# Patient Record
Sex: Male | Born: 1960 | Race: Black or African American | Hispanic: No | Marital: Married | Smoking: Never smoker
Health system: Southern US, Community
[De-identification: ages and names within clinical notes are randomized; demographics above are authoritative.]

## PROBLEM LIST (undated history)

## (undated) DIAGNOSIS — I10 Essential (primary) hypertension: Secondary | ICD-10-CM

## (undated) DIAGNOSIS — E119 Type 2 diabetes mellitus without complications: Secondary | ICD-10-CM

## (undated) HISTORY — PX: KNEE SURGERY: SHX244

## (undated) HISTORY — PX: FEMUR FRACTURE SURGERY: SHX633

---

## 1998-11-18 ENCOUNTER — Emergency Department (HOSPITAL_COMMUNITY): Admission: EM | Admit: 1998-11-18 | Discharge: 1998-11-18 | Payer: Self-pay | Admitting: Emergency Medicine

## 2000-03-08 ENCOUNTER — Ambulatory Visit (HOSPITAL_BASED_OUTPATIENT_CLINIC_OR_DEPARTMENT_OTHER): Admission: RE | Admit: 2000-03-08 | Discharge: 2000-03-08 | Payer: Self-pay | Admitting: Otolaryngology

## 2010-03-29 ENCOUNTER — Inpatient Hospital Stay (HOSPITAL_COMMUNITY)
Admission: EM | Admit: 2010-03-29 | Discharge: 2010-04-06 | Payer: Self-pay | Source: Home / Self Care | Attending: Orthopedic Surgery | Admitting: Orthopedic Surgery

## 2010-07-07 LAB — CBC
HCT: 21.3 % — ABNORMAL LOW (ref 39.0–52.0)
HCT: 24.5 % — ABNORMAL LOW (ref 39.0–52.0)
HCT: 33 % — ABNORMAL LOW (ref 39.0–52.0)
HCT: 44.8 % (ref 39.0–52.0)
Hemoglobin: 11.3 g/dL — ABNORMAL LOW (ref 13.0–17.0)
Hemoglobin: 15.5 g/dL (ref 13.0–17.0)
Hemoglobin: 8.9 g/dL — ABNORMAL LOW (ref 13.0–17.0)
MCH: 30.8 pg (ref 26.0–34.0)
MCH: 31.4 pg (ref 26.0–34.0)
MCH: 31.4 pg (ref 26.0–34.0)
MCHC: 34.6 g/dL (ref 30.0–36.0)
MCHC: 35.2 g/dL (ref 30.0–36.0)
MCHC: 35.5 g/dL (ref 30.0–36.0)
MCV: 88.4 fL (ref 78.0–100.0)
MCV: 90 fL (ref 78.0–100.0)
MCV: 90.7 fL (ref 78.0–100.0)
MCV: 90.9 fL (ref 78.0–100.0)
RBC: 2.89 MIL/uL — ABNORMAL LOW (ref 4.22–5.81)
RBC: 3.64 MIL/uL — ABNORMAL LOW (ref 4.22–5.81)
RDW: 13.4 % (ref 11.5–15.5)
RDW: 13.7 % (ref 11.5–15.5)
RDW: 14.1 % (ref 11.5–15.5)
WBC: 11.2 10*3/uL — ABNORMAL HIGH (ref 4.0–10.5)
WBC: 11.6 10*3/uL — ABNORMAL HIGH (ref 4.0–10.5)

## 2010-07-07 LAB — POCT I-STAT, CHEM 8
BUN: 10 mg/dL (ref 6–23)
Calcium, Ion: 1.05 mmol/L — ABNORMAL LOW (ref 1.12–1.32)
Chloride: 102 mEq/L (ref 96–112)
Creatinine, Ser: 1.1 mg/dL (ref 0.4–1.5)
Sodium: 139 mEq/L (ref 135–145)

## 2010-07-07 LAB — CROSSMATCH
Antibody Screen: NEGATIVE
Unit division: 0

## 2010-07-07 LAB — BASIC METABOLIC PANEL
BUN: 7 mg/dL (ref 6–23)
BUN: 8 mg/dL (ref 6–23)
CO2: 30 mEq/L (ref 19–32)
CO2: 30 mEq/L (ref 19–32)
Chloride: 97 mEq/L (ref 96–112)
Chloride: 99 mEq/L (ref 96–112)
Creatinine, Ser: 1.12 mg/dL (ref 0.4–1.5)
GFR calc Af Amer: >60 mL/min (ref >60)
GFR calc non Af Amer: >60 mL/min (ref >60)
Glucose, Bld: 138 mg/dL — ABNORMAL HIGH (ref 70–99)
Glucose, Bld: 160 mg/dL — ABNORMAL HIGH (ref 70–99)
Potassium: 3.4 mEq/L — ABNORMAL LOW (ref 3.5–5.1)
Potassium: 3.8 mEq/L (ref 3.5–5.1)
Potassium: 3.8 mEq/L (ref 3.5–5.1)

## 2010-07-07 LAB — COMPREHENSIVE METABOLIC PANEL
ALT: 29 U/L (ref 0–53)
BUN: 9 mg/dL (ref 6–23)
CO2: 25 mEq/L (ref 19–32)
Calcium: 8.2 mg/dL — ABNORMAL LOW (ref 8.4–10.5)
GFR calc non Af Amer: >60 mL/min (ref >60)
Glucose, Bld: 141 mg/dL — ABNORMAL HIGH (ref 70–99)
Sodium: 135 mEq/L (ref 135–145)
Total Protein: 6 g/dL (ref 6.0–8.3)

## 2010-07-07 LAB — ETHANOL: Alcohol, Ethyl (B): <5 mg/dL (ref 0–10)

## 2010-07-07 LAB — LACTIC ACID, PLASMA: Lactic Acid, Venous: 3.3 mmol/L — ABNORMAL HIGH (ref 0.5–2.2)

## 2010-07-07 LAB — PROTIME-INR
INR: 0.88 (ref 0.00–1.49)
Prothrombin Time: 12.1 seconds (ref 11.6–15.2)

## 2010-07-07 LAB — ABO/RH: ABO/RH(D): O POS

## 2010-07-07 LAB — SAMPLE TO BLOOD BANK

## 2012-12-26 ENCOUNTER — Emergency Department (HOSPITAL_COMMUNITY)
Admission: EM | Admit: 2012-12-26 | Discharge: 2012-12-26 | Disposition: A | Discharge status: Home / Self Care | Payer: BC Managed Care – PPO | Attending: Emergency Medicine | Admitting: Emergency Medicine

## 2012-12-26 ENCOUNTER — Encounter (HOSPITAL_COMMUNITY): Payer: Self-pay | Admitting: *Deleted

## 2012-12-26 DIAGNOSIS — Z711 Person with feared health complaint in whom no diagnosis is made: Secondary | ICD-10-CM

## 2012-12-26 DIAGNOSIS — R51 Headache: Secondary | ICD-10-CM | POA: Insufficient documentation

## 2012-12-26 DIAGNOSIS — K921 Melena: Secondary | ICD-10-CM | POA: Insufficient documentation

## 2012-12-26 DIAGNOSIS — R109 Unspecified abdominal pain: Secondary | ICD-10-CM | POA: Insufficient documentation

## 2012-12-26 DIAGNOSIS — E669 Obesity, unspecified: Secondary | ICD-10-CM | POA: Insufficient documentation

## 2012-12-26 DIAGNOSIS — Z202 Contact with and (suspected) exposure to infections with a predominantly sexual mode of transmission: Secondary | ICD-10-CM | POA: Insufficient documentation

## 2012-12-26 LAB — COMPREHENSIVE METABOLIC PANEL
Alkaline Phosphatase: 62 U/L (ref 39–117)
BUN: 12 mg/dL (ref 6–23)
Calcium: 8.9 mg/dL (ref 8.4–10.5)
Creatinine, Ser: 1 mg/dL (ref 0.50–1.35)
GFR calc Af Amer: >90 mL/min (ref >90)
Glucose, Bld: 137 mg/dL — ABNORMAL HIGH (ref 70–99)
Potassium: 4.4 mEq/L (ref 3.5–5.1)
Total Protein: 6.4 g/dL (ref 6.0–8.3)

## 2012-12-26 LAB — URINE MICROSCOPIC-ADD ON

## 2012-12-26 LAB — CBC WITH DIFFERENTIAL/PLATELET
Basophils Absolute: 0 10*3/uL (ref 0.0–0.1)
HCT: 43.8 % (ref 39.0–52.0)
Lymphocytes Relative: 35 % (ref 12–46)
Monocytes Absolute: 0.8 10*3/uL (ref 0.1–1.0)
Neutro Abs: 4.9 10*3/uL (ref 1.7–7.7)
RDW: 13.3 % (ref 11.5–15.5)
WBC: 8.9 10*3/uL (ref 4.0–10.5)

## 2012-12-26 LAB — LIPASE, BLOOD: Lipase: 31 U/L (ref 11–59)

## 2012-12-26 LAB — URINALYSIS, ROUTINE W REFLEX MICROSCOPIC
Nitrite: NEGATIVE
Specific Gravity, Urine: 1.016 (ref 1.005–1.030)
pH: 6 (ref 5.0–8.0)

## 2012-12-26 MED ORDER — CEFTRIAXONE SODIUM 250 MG IJ SOLR
250.0000 mg | Freq: Once | INTRAMUSCULAR | Status: AC
  Administered 2012-12-26: 250 mg via INTRAMUSCULAR
  Filled 2012-12-26: qty 250

## 2012-12-26 MED ORDER — AZITHROMYCIN 250 MG PO TABS
1000.0000 mg | ORAL_TABLET | Freq: Once | ORAL | Status: AC
  Administered 2012-12-26: 1000 mg via ORAL
  Filled 2012-12-26: qty 4

## 2012-12-26 NOTE — ED Provider Notes (Signed)
CSN: [id number]     Arrival date & time 12/26/12  0310 History   First MD Initiated Contact with Patient 12/26/12 [phone]     Chief Complaint  Patient presents with  . Abdominal Pain   (Consider location/radiation/quality/duration/timing/severity/associated sxs/prior Treatment) HPI This is a 52 year old male who presents with abdominal pain. The patient reports 3-4 days of crampy lower quadrant abdominal pain. He reports his pain is 3/10 right now. It is non-radiating. It is worse when he lays down. It is not getting any better with Alka-Seltzer. He describes it as an 8. He denies any nausea, vomiting, diarrhea. The patient reports a small amount of blood in his stool 3 days ago. He denies any fevers. Patient reports being diagnosed in the past with possible diverticulosis.    History reviewed. No pertinent past medical history. Past Surgical History  Procedure Laterality Date  . Femur fracture surgery     No family history on file. History  Substance Use Topics  . Smoking status: Never Smoker   . Smokeless tobacco: Not on file  . Alcohol Use: Not on file    Review of Systems  Constitutional: Negative.  Negative for fever.  Respiratory: Negative.  Negative for chest tightness and shortness of breath.   Cardiovascular: Negative.  Negative for chest pain.  Gastrointestinal: Positive for abdominal pain and blood in stool. Negative for nausea, vomiting and diarrhea.  Genitourinary: Negative.  Negative for discharge and testicular pain.  Skin: Negative for rash.  Neurological: Positive for headaches.  Psychiatric/Behavioral: The patient is not nervous/anxious.   All other systems reviewed and are negative.    Allergies  Review of patient's allergies indicates no known allergies.  Home Medications  No current outpatient prescriptions on file. BP 148/88  Pulse 70  Temp(Src) 98.9 F (37.2 C) (Oral)  Resp 20  Ht 5\' 6"  (1.676 m)  Wt 234 lb 6 oz (106.312 kg)  BMI 37.85 kg/m2  SpO2  100% Physical Exam  Nursing note and vitals reviewed. Constitutional: He is oriented to person, place, and time. He appears well-developed and well-nourished.  obese  HENT:  Head: Normocephalic and atraumatic.  Eyes: Pupils are equal, round, and reactive to light.  Neck: Neck supple.  Cardiovascular: Normal rate, regular rhythm and normal heart sounds.   No murmur heard. Pulmonary/Chest: Effort normal and breath sounds normal. No respiratory distress. He has no wheezes.  Abdominal: Soft. Bowel sounds are normal. He exhibits no distension. There is no tenderness. There is no rebound.  Genitourinary: Penis normal. Guaiac negative stool.  Musculoskeletal: He exhibits no edema.  Lymphadenopathy:    He has no cervical adenopathy.  Neurological: He is alert and oriented to person, place, and time.  Skin: Skin is warm and dry.  Psychiatric: He has a normal mood and affect.    ED Course  Procedures (including critical care time) Labs Review Labs Reviewed  COMPREHENSIVE METABOLIC PANEL - Abnormal; Notable for the following:    Glucose, Bld 137 (*)    Albumin 3.4 (*)    GFR calc non Af Amer 86 (*)    All other components within normal limits  URINALYSIS, ROUTINE W REFLEX MICROSCOPIC - Abnormal; Notable for the following:    Leukocytes, UA SMALL (*)    All other components within normal limits  GC/CHLAMYDIA PROBE AMP  CBC WITH DIFFERENTIAL  LIPASE, BLOOD  URINE MICROSCOPIC-ADD ON   Imaging Review No results found.  MDM   1. Abdominal pain   2. Concern about STD in  male without diagnosis     This is a 52 year old male who presents with 3 days of abdominal pain. He is nontoxic-appearing on exam his vital signs are within normal limits. Lab work was obtained.  And is unremarkable with the exception of small leukocytes and rare bacteria in his urine. I discussed these findings with the patient. He is monogamous and denies any penile discharge. Patient was tested for [facility] and Chlamydia.  He will be appear to be treated. I discussed with the patient his other normal lab findings. At this time given his reassuring exam, normal vital signs, and reassuring labs, I have low suspicion for intra-abdominal pathology. Patient does have a history of diverticulosis. He is guaiuc negative here.  Other considerations include colitis, however the patient is not having any diarrhea. Patient has a primary care physician he can followup with. He was given strict return precautions. He is to followup with his primary care physician for repeat abdominal exam. If he has worsening of his symptoms he is encouraged come back for imaging.  After history, exam, and medical workup I feel the patient has been appropriately medically screened and is safe for discharge home. Pertinent diagnoses were discussed with the patient. Patient was given return precautions.   Shon Baton, MD 12/26/12 [phone]

## 2012-12-26 NOTE — ED Notes (Signed)
Pt c/o lower abd pain x 3-4 days; denies n/v/d; denies urinary symptoms

## 2012-12-27 ENCOUNTER — Emergency Department (HOSPITAL_COMMUNITY)
Admission: EM | Admit: 2012-12-27 | Discharge: 2012-12-27 | Disposition: A | Discharge status: Home / Self Care | Payer: BC Managed Care – PPO | Attending: Emergency Medicine | Admitting: Emergency Medicine

## 2012-12-27 ENCOUNTER — Emergency Department (HOSPITAL_COMMUNITY): Payer: BC Managed Care – PPO

## 2012-12-27 ENCOUNTER — Encounter (HOSPITAL_COMMUNITY): Payer: Self-pay | Admitting: Family Medicine

## 2012-12-27 DIAGNOSIS — R0789 Other chest pain: Secondary | ICD-10-CM | POA: Insufficient documentation

## 2012-12-27 DIAGNOSIS — R079 Chest pain, unspecified: Secondary | ICD-10-CM

## 2012-12-27 LAB — TROPONIN I: Troponin I: <0.3 ng/mL (ref <0.30)

## 2012-12-27 LAB — COMPREHENSIVE METABOLIC PANEL
Alkaline Phosphatase: 69 U/L (ref 39–117)
BUN: 10 mg/dL (ref 6–23)
Calcium: 9.4 mg/dL (ref 8.4–10.5)
GFR calc Af Amer: >90 mL/min (ref >90)
GFR calc non Af Amer: 82 mL/min — ABNORMAL LOW (ref >90)
Glucose, Bld: 109 mg/dL — ABNORMAL HIGH (ref 70–99)
Total Protein: 6.2 g/dL (ref 6.0–8.3)

## 2012-12-27 LAB — CBC
HCT: 43.5 % (ref 39.0–52.0)
Hemoglobin: 14.8 g/dL (ref 13.0–17.0)
MCH: 30.2 pg (ref 26.0–34.0)
MCHC: 34 g/dL (ref 30.0–36.0)

## 2012-12-27 MED ORDER — GI COCKTAIL ~~LOC~~
30.0000 mL | Freq: Once | ORAL | Status: AC
  Administered 2012-12-27: 30 mL via ORAL
  Filled 2012-12-27: qty 30

## 2012-12-27 MED ORDER — FAMOTIDINE 20 MG PO TABS
20.0000 mg | ORAL_TABLET | Freq: Once | ORAL | Status: AC
  Administered 2012-12-27: 20 mg via ORAL
  Filled 2012-12-27: qty 1

## 2012-12-27 NOTE — ED Provider Notes (Signed)
CSN: [id number]     Arrival date & time 12/27/12  0148 History   First MD Initiated Contact with Patient 12/27/12 0255     Chief Complaint  Patient presents with  . Chest Pain   (Consider location/radiation/quality/duration/timing/severity/associated sxs/prior Treatment) Patient is a 52 y.o. male presenting with chest pain. The history is provided by the patient.  Chest Pain Associated symptoms: no abdominal pain, no back pain, no cough, no fever, no headache, no nausea, no shortness of breath and not vomiting   pt c/o cp for the past 1-2 days. Constant. Midline, lower sternal area. Dull, moderate. Non radiating. No pleuritic pain. No leg pain or swelling.  Worse w resting/lying flat. No relation to activity or exertion. Denies sob, nv or diaphoresis. No unusual doe or fatigue. Denies fam hx premature cad. Denies prior stress test. Pt does note hx gerd. Non smoker. No drug/cocaine use. Pt denies cough or uri c/o. No fever or chills. No chest wall trauma or strain.     History reviewed. No pertinent past medical history. Past Surgical History  Procedure Laterality Date  . Femur fracture surgery     No family history on file. History  Substance Use Topics  . Smoking status: Never Smoker   . Smokeless tobacco: Not on file  . Alcohol Use: Yes     Comment: Seldom    Review of Systems  Constitutional: Negative for fever.  HENT: Negative for neck pain.   Eyes: Negative for redness.  Respiratory: Negative for cough and shortness of breath.   Cardiovascular: Positive for chest pain. Negative for leg swelling.  Gastrointestinal: Negative for nausea, vomiting and abdominal pain.  Genitourinary: Negative for flank pain.  Musculoskeletal: Negative for back pain.  Skin: Negative for rash.  Neurological: Negative for headaches.  Hematological: Does not bruise/bleed easily.  Psychiatric/Behavioral: Negative for confusion.    Allergies  Review of patient's allergies indicates no known  allergies.  Home Medications   Current Outpatient Rx  Name  Route  Sig  Dispense  Refill  . traMADol (ULTRAM) 50 MG tablet   Oral   Take 50 mg by mouth every 6 (six) hours as needed for pain.          BP 160/90  Pulse 83  Temp(Src) 98.4 F (36.9 C) (Oral)  Resp 20  SpO2 99% Physical Exam  Nursing note and vitals reviewed. Constitutional: He is oriented to person, place, and time. He appears well-developed and well-nourished. No distress.  HENT:  Head: Atraumatic.  Eyes: Conjunctivae are normal. No scleral icterus.  Neck: Neck supple. No tracheal deviation present.  Cardiovascular: Normal rate, regular rhythm, normal heart sounds and intact distal pulses.  Exam reveals no gallop and no friction rub.   No murmur heard. Pulmonary/Chest: Effort normal and breath sounds normal. No accessory muscle usage. No respiratory distress. He exhibits no tenderness.  Abdominal: Soft. He exhibits no distension. There is no tenderness.  Musculoskeletal: Normal range of motion. He exhibits no edema and no tenderness.  Neurological: He is alert and oriented to person, place, and time.  Skin: Skin is warm and dry. He is not diaphoretic.  Psychiatric: He has a normal mood and affect.    ED Course  Procedures (including critical care time)  Results for orders placed during the hospital encounter of 12/27/12  CBC      Result Value Range   WBC 9.4  4.0 - 10.5 K/uL   RBC 4.90  4.22 - 5.81 MIL/uL   Hemoglobin  14.8  13.0 - 17.0 g/dL   HCT 16.1  09.6 - 04.5 %   MCV 88.8  78.0 - 100.0 fL   MCH 30.2  26.0 - 34.0 pg   MCHC 34.0  30.0 - 36.0 g/dL   RDW 40.9  81.1 - 91.4 %   Platelets 222  150 - 400 K/uL  COMPREHENSIVE METABOLIC PANEL      Result Value Range   Sodium 140  135 - 145 mEq/L   Potassium 3.8  3.5 - 5.1 mEq/L   Chloride 106  96 - 112 mEq/L   CO2 27  19 - 32 mEq/L   Glucose, Bld 109 (*) 70 - 99 mg/dL   BUN 10  6 - 23 mg/dL   Creatinine, Ser 7.82  0.50 - 1.35 mg/dL   Calcium 9.4   8.4 - 95.6 mg/dL   Total Protein 6.2  6.0 - 8.3 g/dL   Albumin 3.3 (*) 3.5 - 5.2 g/dL   AST 23  0 - 37 U/L   ALT 38  0 - 53 U/L   Alkaline Phosphatase 69  39 - 117 U/L   Total Bilirubin 0.3  0.3 - 1.2 mg/dL   GFR calc non Af Amer 82 (*) >90 mL/min   GFR calc Af Amer >90  >90 mL/min  TROPONIN I      Result Value Range   Troponin I <0.30  <0.30 ng/mL   Dg Chest 2 View  12/27/2012   *RADIOLOGY REPORT*  Clinical Data: Chest pain.  CHEST - 2 VIEW  Comparison: 04/08/2010.  Findings: No significant osseous abnormality.  Lungs are clear. No effusion or pneumothorax.  Cardiomediastinal size and contour are within normal limits.  The upper abdomen is unremarkable.  IMPRESSION: No evidence of acute cardiopulmonary disease.   Original Report Authenticated By: Tiburcio Pea     MDM  Labs. Cxr.  Pt notes symptoms worse w rest, lying flat. Pt notes hx gerd. pepcid and gi cocktail po for symptom relief.   Date: 12/27/2012  Rate: 71  Rhythm: normal sinus rhythm  QRS Axis: normal  Intervals: normal  ST/T Wave abnormalities: normal  Conduction Disutrbances:none  Narrative Interpretation:   Old EKG Reviewed: none available   After symptoms present/constant > 1 day, trop neg.  Recheck pt comfortable. Symptoms do not appear c/w acs.  Pt comfortable, relief w gi meds, appears stable for d/c.      Suzi Roots, MD 12/27/12 [phone]

## 2012-12-27 NOTE — ED Notes (Signed)
Patient states that he has had chest pain since yesterday. Patient states pain got worse tonight while laying down.

## 2012-12-27 NOTE — ED Notes (Signed)
Patient is alert and oriented x3.  He was given DC instructions and follow up visit instructions.  Patient gave verbal understanding.  He was DC ambulatory under his own power to home.  V/S stable.  He was not showing any signs of distress on DC 

## 2015-09-02 IMAGING — CR DG CHEST 2V
2 series · 2 of 2 positions shown · non-contrast
Comparison: 04/08/2010.

CLINICAL DATA: Chest pain.

CHEST - 2 VIEW

[w chest pa]
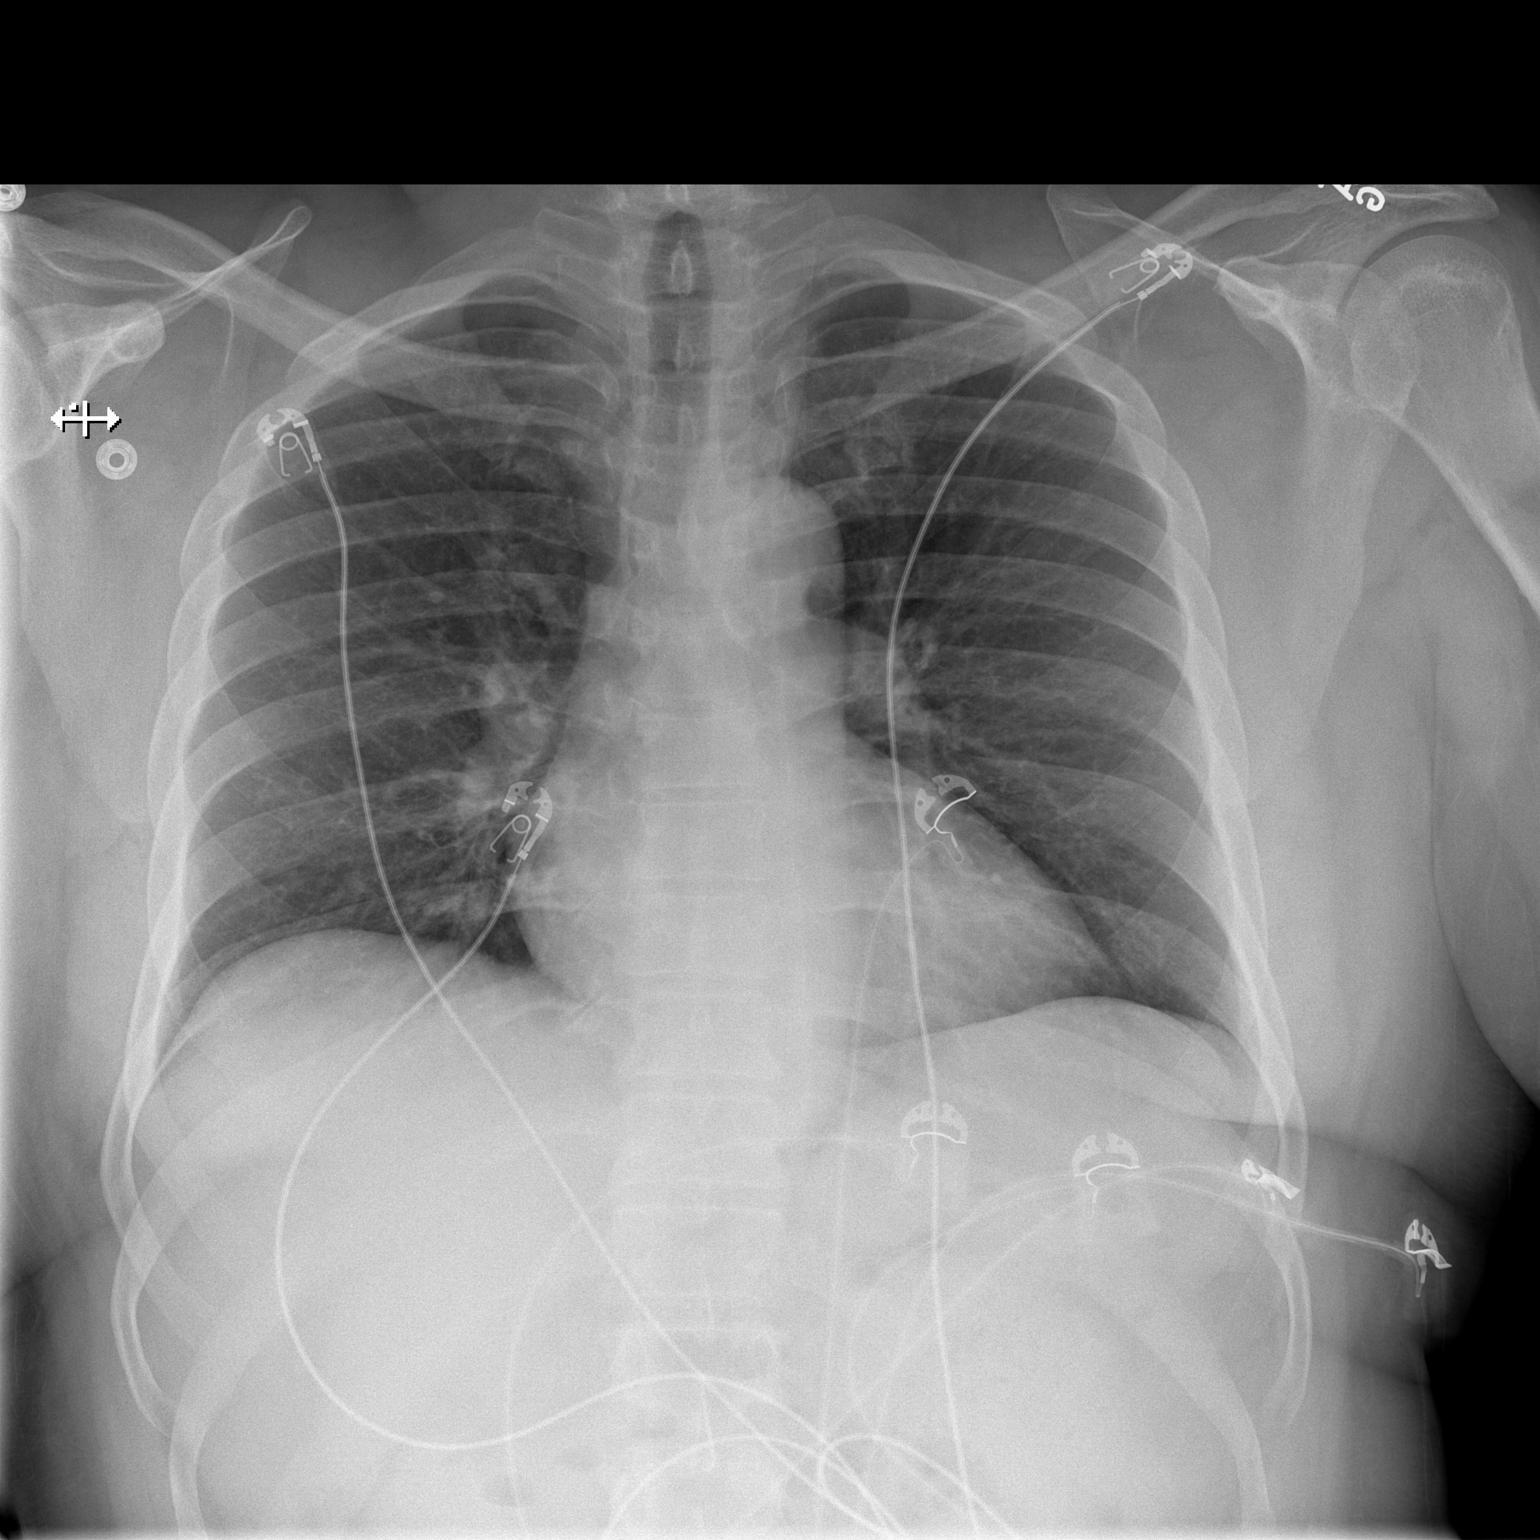

[w chest lat]
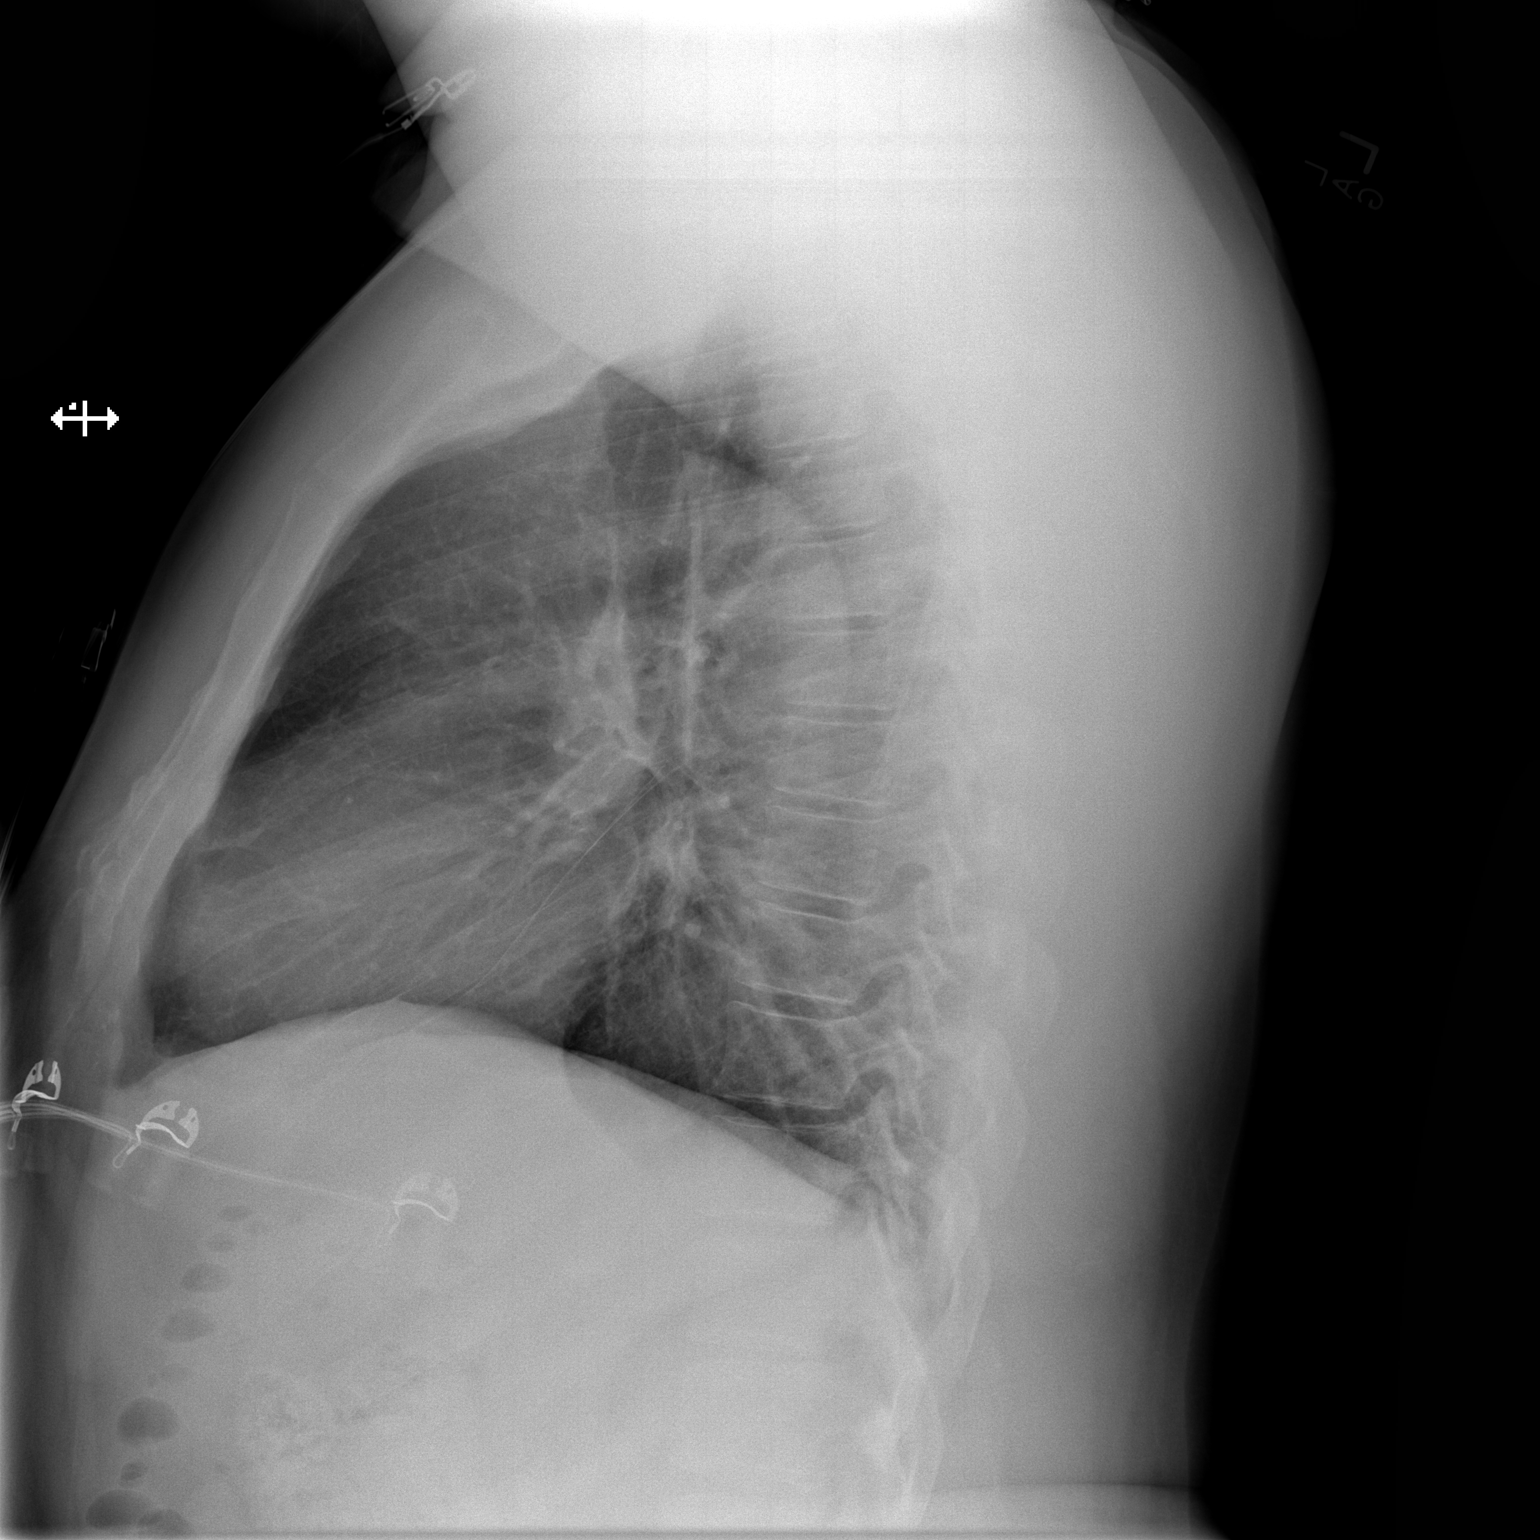

[2 of 2 positions shown; findings below may reference images not displayed]

FINDINGS: No significant osseous abnormality.  Lungs are clear. No
effusion or pneumothorax.  Cardiomediastinal size and contour are
within normal limits.  The upper abdomen is unremarkable.
IMPRESSION: No evidence of acute cardiopulmonary disease.

## 2020-01-01 DIAGNOSIS — R972 Elevated prostate specific antigen [PSA]: Secondary | ICD-10-CM | POA: Insufficient documentation

## 2020-01-01 DIAGNOSIS — N401 Enlarged prostate with lower urinary tract symptoms: Secondary | ICD-10-CM | POA: Insufficient documentation

## 2021-12-30 ENCOUNTER — Ambulatory Visit (INDEPENDENT_AMBULATORY_CARE_PROVIDER_SITE_OTHER): Payer: Self-pay | Admitting: Nurse Practitioner

## 2021-12-30 DIAGNOSIS — F4310 Post-traumatic stress disorder, unspecified: Secondary | ICD-10-CM | POA: Insufficient documentation

## 2021-12-30 DIAGNOSIS — S72309A Unspecified fracture of shaft of unspecified femur, initial encounter for closed fracture: Secondary | ICD-10-CM | POA: Insufficient documentation

## 2021-12-30 DIAGNOSIS — H65112 Acute and subacute allergic otitis media (mucoid) (sanguinous) (serous), left ear: Secondary | ICD-10-CM

## 2021-12-30 DIAGNOSIS — E669 Obesity, unspecified: Secondary | ICD-10-CM | POA: Insufficient documentation

## 2021-12-30 DIAGNOSIS — M549 Dorsalgia, unspecified: Secondary | ICD-10-CM | POA: Insufficient documentation

## 2021-12-30 MED ORDER — FLUTICASONE PROPIONATE 50 MCG/ACT NA SUSP: 2.0000 | Freq: Every day | NASAL | 6 refills | Status: AC

## 2021-12-30 MED ORDER — AMOXICILLIN-POT CLAVULANATE 875-125 MG PO TABS: 1.0000 | ORAL_TABLET | Freq: Two times a day (BID) | ORAL | 0 refills | Status: DC

## 2021-12-30 NOTE — Progress Notes (Signed)
[facility] [address] [place], [place] [address] [phone]  Office Visit Note  Patient Name: Aaron Willis Date of Birth [id number]  Medical Record number [id number]  Date of Service: 12/30/2021     HPI 61 year old male presenting to [facility] with complaints of left ear pain. He has been chronically congested. Notes he uses Afrin nasal spray daily for the past 2 years.   Does not take other OTC allergy medications Has tried Flonase in the past   Denies fevers today or other systemic symptoms    Current Medication:  Outpatient Encounter Medications as of 12/30/2021  Medication Sig   amoxicillin-clavulanate (AUGMENTIN) 875-125 MG tablet Take 1 tablet by mouth 2 (two) times daily. Take with food   fluticasone (FLONASE) 50 MCG/ACT nasal spray Place 2 sprays into both nostrils daily.   traMADol (ULTRAM) 50 MG tablet Take 50 mg by mouth every 6 (six) hours as needed for pain.   No facility-administered encounter medications on file as of 12/30/2021.     Review of Systems  Constitutional: Negative.   HENT:  Positive for congestion, ear pain and sinus pressure.   Eyes: Negative.   Respiratory: Negative.    Neurological: Negative.     Physical Exam HENT:     Head: Normocephalic.     Right Ear: Hearing normal. A middle ear effusion is present.     Left Ear: Hearing normal. A middle ear effusion is present. Tympanic membrane is erythematous.     Nose: Nose normal.     Right Turbinates: Enlarged and swollen.     Left Turbinates: Enlarged and swollen.  Pulmonary:     Effort: Pulmonary effort is normal.  Musculoskeletal:     Cervical back: Normal range of motion.  Neurological:     General: No focal deficit present.     Mental Status: He is alert.       Assessment/Plan: 1. Acute mucoid otitis media of left ear Advised stopping the use of Afrin  Use OTC Claritin daily   - amoxicillin-clavulanate (AUGMENTIN) 875-125 MG tablet; Take 1  tablet by mouth 2 (two) times daily. Take with food  Dispense: 20 tablet; Refill: 0 - fluticasone (FLONASE) 50 MCG/ACT nasal spray; Place 2 sprays into both nostrils daily.  Dispense: 16 g; Refill: 6    General Counseling: Krzysztof verbalizes understanding of the findings of todays visit and agrees with plan of treatment. I have discussed any further diagnostic evaluation that may be needed or ordered today. We also reviewed his medications today. he has been encouraged to call the office with any questions or concerns that should arise related to todays visit.    Meds ordered this encounter  Medications   amoxicillin-clavulanate (AUGMENTIN) 875-125 MG tablet    Sig: Take 1 tablet by mouth 2 (two) times daily. Take with food    Dispense:  20 tablet    Refill:  0   fluticasone (FLONASE) 50 MCG/ACT nasal spray    Sig: Place 2 sprays into both nostrils daily.    Dispense:  16 g    Refill:  6    Time spent:15 Minutes   Viviano Simas [facility] Nurse Practitioner

## 2022-04-13 ENCOUNTER — Encounter (HOSPITAL_COMMUNITY): Payer: Self-pay

## 2022-04-13 ENCOUNTER — Emergency Department (HOSPITAL_COMMUNITY)
Admission: EM | Admit: 2022-04-13 | Discharge: 2022-04-14 | Disposition: A | Discharge status: Home / Self Care | Payer: No Typology Code available for payment source | Attending: Emergency Medicine | Admitting: Emergency Medicine

## 2022-04-13 ENCOUNTER — Other Ambulatory Visit: Payer: Self-pay

## 2022-04-13 DIAGNOSIS — I10 Essential (primary) hypertension: Secondary | ICD-10-CM | POA: Diagnosis not present

## 2022-04-13 DIAGNOSIS — E871 Hypo-osmolality and hyponatremia: Secondary | ICD-10-CM | POA: Insufficient documentation

## 2022-04-13 DIAGNOSIS — J101 Influenza due to other identified influenza virus with other respiratory manifestations: Secondary | ICD-10-CM | POA: Insufficient documentation

## 2022-04-13 DIAGNOSIS — R110 Nausea: Secondary | ICD-10-CM | POA: Diagnosis not present

## 2022-04-13 DIAGNOSIS — E1165 Type 2 diabetes mellitus with hyperglycemia: Secondary | ICD-10-CM | POA: Diagnosis not present

## 2022-04-13 DIAGNOSIS — Z79899 Other long term (current) drug therapy: Secondary | ICD-10-CM | POA: Diagnosis not present

## 2022-04-13 DIAGNOSIS — R739 Hyperglycemia, unspecified: Secondary | ICD-10-CM

## 2022-04-13 DIAGNOSIS — R7989 Other specified abnormal findings of blood chemistry: Secondary | ICD-10-CM

## 2022-04-13 DIAGNOSIS — Z1152 Encounter for screening for COVID-19: Secondary | ICD-10-CM | POA: Diagnosis not present

## 2022-04-13 DIAGNOSIS — R197 Diarrhea, unspecified: Secondary | ICD-10-CM | POA: Insufficient documentation

## 2022-04-13 HISTORY — DX: Type 2 diabetes mellitus without complications: E11.9

## 2022-04-13 HISTORY — DX: Essential (primary) hypertension: I10

## 2022-04-13 LAB — CBG MONITORING, ED
Glucose-Capillary: 337 mg/dL — ABNORMAL HIGH (ref 70–99)
Glucose-Capillary: 278 mg/dL — ABNORMAL HIGH (ref 70–99)
Glucose-Capillary: 236 mg/dL — ABNORMAL HIGH (ref 70–99)

## 2022-04-13 LAB — CBC WITH DIFFERENTIAL/PLATELET
Abs Immature Granulocytes: 0.03 10*3/uL (ref 0.00–0.07)
Basophils Absolute: 0 10*3/uL (ref 0.0–0.1)
Basophils Relative: 1 %
Eosinophils Absolute: 0.1 10*3/uL (ref 0.0–0.5)
Eosinophils Relative: 2 %
HCT: 50.2 % (ref 39.0–52.0)
Hemoglobin: 17.2 g/dL — ABNORMAL HIGH (ref 13.0–17.0)
Immature Granulocytes: 1 %
Lymphocytes Relative: 36 %
Lymphs Abs: 2.3 10*3/uL (ref 0.7–4.0)
MCH: 32.1 pg (ref 26.0–34.0)
MCHC: 34.3 g/dL (ref 30.0–36.0)
MCV: 93.8 fL (ref 80.0–100.0)
Monocytes Absolute: 0.6 10*3/uL (ref 0.1–1.0)
Monocytes Relative: 9 %
Neutro Abs: 3.3 10*3/uL (ref 1.7–7.7)
Neutrophils Relative %: 51 %
Platelets: 151 10*3/uL (ref 150–400)
RBC: 5.35 MIL/uL (ref 4.22–5.81)
RDW: 12.7 % (ref 11.5–15.5)
WBC: 6.3 10*3/uL (ref 4.0–10.5)
nRBC: 0 % (ref 0.0–0.2)

## 2022-04-13 LAB — RESP PANEL BY RT-PCR (RSV, FLU A&B, COVID)  RVPGX2
Influenza A by PCR: POSITIVE — AB
Influenza B by PCR: NEGATIVE
Resp Syncytial Virus by PCR: NEGATIVE
SARS Coronavirus 2 by RT PCR: NEGATIVE

## 2022-04-13 LAB — BASIC METABOLIC PANEL
Anion gap: 12 (ref 5–15)
BUN: 15 mg/dL (ref 6–20)
CO2: 26 mmol/L (ref 22–32)
Calcium: 8.9 mg/dL (ref 8.9–10.3)
Chloride: 94 mmol/L — ABNORMAL LOW (ref 98–111)
Creatinine, Ser: 1.39 mg/dL — ABNORMAL HIGH (ref 0.61–1.24)
GFR, Estimated: 58 mL/min — ABNORMAL LOW (ref >60)
Glucose, Bld: 305 mg/dL — ABNORMAL HIGH (ref 70–99)
Potassium: 4.5 mmol/L (ref 3.5–5.1)
Sodium: 132 mmol/L — ABNORMAL LOW (ref 135–145)

## 2022-04-13 LAB — BETA-HYDROXYBUTYRIC ACID: Beta-Hydroxybutyric Acid: 0.51 mmol/L — ABNORMAL HIGH (ref 0.05–0.27)

## 2022-04-13 MED ORDER — SODIUM CHLORIDE 0.9 % IV BOLUS: 1000.0000 mL | Freq: Once | INTRAVENOUS | Status: DC

## 2022-04-13 NOTE — ED Provider Notes (Signed)
[facility]-EMERGENCY DEPT Provider Note   CSN: [id number] Arrival date & time: 04/13/22  0907     History  Chief Complaint  Patient presents with   Hyperglycemia    Aaron Willis is a 61 y.o. male.  The history is provided by the patient.  Hyperglycemia He has history of hypertension, newly diagnosed diabetes and comes to the emergency department because of some cramping in his upper abdomen and in his legs.  Cramping has been present for several weeks to several months but got worse over the last 2 days.  He also had onset 2 days ago nausea and diarrhea.  Diarrhea has stopped but nausea continues.  He has not vomited.  He has had some chills without fever or sweats.  There has been a cough productive of some green sputum.  He denies any sick contacts.  He was started on metformin by his primary care provider yesterday.  He was also told his blood pressure was elevated and they increased his blood pressure medicine and he has a follow-up appointment in 1 month.   Home Medications Prior to Admission medications   Medication Sig Start Date End Date Taking? Authorizing Provider  amoxicillin-clavulanate (AUGMENTIN) 875-125 MG tablet Take 1 tablet by mouth 2 (two) times daily. Take with food 12/30/21   Viviano Simas, FNP  fluticasone [facility]) 50 MCG/ACT nasal spray Place 2 sprays into both nostrils daily. 12/30/21   Viviano Simas, FNP  traMADol (ULTRAM) 50 MG tablet Take 50 mg by mouth every 6 (six) hours as needed for pain.    [provider]      Allergies    Patient has no known allergies.    Review of Systems   Review of Systems  All other systems reviewed and are negative.   Physical Exam Updated Vital Signs BP (!) 177/116   Pulse 74   Temp 97.8 F (36.6 C) (Oral)   Resp 19   Ht 5\' 6"  (1.676 m)   Wt 111.6 kg   SpO2 100%   BMI 39.71 kg/m  Physical Exam Vitals and nursing note reviewed.   61 year old male, resting comfortably and in no  acute distress. Vital signs are significant for elevated blood pressure. Oxygen saturation is 100%, which is normal. Head is normocephalic and atraumatic. PERRLA, EOMI. Oropharynx is clear. Neck is nontender and supple without adenopathy or JVD. Back is nontender and there is no CVA tenderness. Lungs are clear without rales, wheezes, or rhonchi. Chest is nontender. Heart has regular rate and rhythm without murmur. Abdomen is soft, flat, nontender. Extremities have no cyanosis or edema, full range of motion is present. Skin is warm and dry without rash. Neurologic: Mental status is normal, cranial nerves are intact, moves all extremities equally.  ED Results / Procedures / Treatments   Labs (all labs ordered are listed, but only abnormal results are displayed) Labs Reviewed  RESP PANEL BY RT-PCR (RSV, FLU A&B, COVID)  RVPGX2 - Abnormal; Notable for the following components:      Result Value   Influenza A by PCR POSITIVE (*)    All other components within normal limits  CBC WITH DIFFERENTIAL/PLATELET - Abnormal; Notable for the following components:   Hemoglobin 17.2 (*)    All other components within normal limits  BASIC METABOLIC PANEL - Abnormal; Notable for the following components:   Sodium 132 (*)    Chloride 94 (*)    Glucose, Bld 305 (*)    Creatinine, Ser  1.39 (*)    GFR, Estimated 58 (*)    All other components within normal limits  BETA-HYDROXYBUTYRIC ACID - Abnormal; Notable for the following components:   Beta-Hydroxybutyric Acid 0.51 (*)    All other components within normal limits  CBG MONITORING, ED - Abnormal; Notable for the following components:   Glucose-Capillary 337 (*)    All other components within normal limits  CBG MONITORING, ED - Abnormal; Notable for the following components:   Glucose-Capillary 278 (*)    All other components within normal limits  CBG MONITORING, ED - Abnormal; Notable for the following components:   Glucose-Capillary 236 (*)     All other components within normal limits  URINALYSIS, ROUTINE W REFLEX MICROSCOPIC    EKG EKG Interpretation  Date/Time:  Tuesday April 13 2022 10:50:25 EST Ventricular Rate:  102 PR Interval:  145 QRS Duration: 84 QT Interval:  329 QTC Calculation: 429 R Axis:   73 Text Interpretation: Sinus tachycardia Probable left atrial enlargement Anterior infarct, old When compared with ECG of 12/27/2012, HEART RATE has increased Confirmed by Dione Booze ([id number]) on 04/13/2022 11:53:36 PM  Procedures Procedures  Cardiac monitor shows normal sinus rhythm, per my interpretation.  Medications Ordered in ED Medications  sodium chloride 0.9 % bolus 1,000 mL (has no administration in time range)    ED Course/ Medical Decision Making/ A&P                           Medical Decision Making  Newly diagnosed diabetes.  Elevated blood pressure and patient with known history of hypertension.  Abdominal cramping of uncertain cause but benign physical exam.  Chills and respiratory symptoms concerning for possible respiratory infection such as influenza or COVID-19.  I have reviewed and interpreted his electrocardiogram, and my interpretation is borderline sinus tachycardia without ST or T changes.  I have reviewed and interpreted his laboratory test, and my interpretation is elevated blood glucose with mild hyponatremia consistent with degree of hyperglycemia, elevated creatinine.  Last creatinine on record was in 2014 and was normal.  Slight polycythemia possibly consistent with dehydration.  Slightly elevated beta hydroxybutyrate of uncertain cause.  Clinically, he does not have ketoacidosis.  Serum CO2 is normal and anion gap is normal.  Respiratory pathogen panel is positive for influenza A.  His symptoms have been present for too long to qualify for antiviral treatment.  I have explained this to the patient.  Glucose was observed in the emergency department and has come down.  I have advised him to  continue taking his metformin continue monitoring his blood pressure at home.  It has not been enough time since his medication change to assess its response.  He will need to follow-up with his primary care provider to make additional adjustments in his antihypertensive regimen and also diabetic medication.  I have encouraged him to drink plenty of fluids, use over-the-counter NSAIDs and acetaminophen for fever or aching.  Because of ongoing nausea, I have ordered a prescription for ondansetron oral dissolving tablet.  Return precautions discussed.  Final Clinical Impression(s) / ED Diagnoses Final diagnoses:  Influenza A  Elevated blood pressure reading with diagnosis of hypertension  Hyperglycemia  Elevated serum creatinine  Hyponatremia  Nausea    Rx / DC Orders ED Discharge Orders          Ordered    ondansetron (ZOFRAN-ODT) 8 MG disintegrating tablet  Every 8 hours PRN  04/14/22 0006              Dione Booze, MD 04/14/22 [phone]

## 2022-04-13 NOTE — ED Triage Notes (Signed)
Went to [facility] yesterday, Dx new onset diabetes, started on Meformin, took does last night and this morning. Dizzy, cramping, headache today.

## 2022-04-13 NOTE — ED Provider Triage Note (Signed)
Emergency Medicine Provider Triage Evaluation Note  Aaron Willis , a 61 y.o. male  was evaluated in triage.    Patient to the ER for concern of right upper quadrant abdominal cramping ongoing x 6 months, no change to symptoms but does report that he was diagnosed with diabetes yesterday and started on metformin he has taken 1 dose.  He reports that that now that he is a diabetic he is concerned that his abdominal cramping may be due to his diabetes.  He does endorse some polyuria over the past few months as well without change.  Finally patient does endorse sore throat and mild headache ongoing for the past 2 days.  He denies any sick contact.    Review of Systems  Positive: Abdominal cramping, polyuria, headache, sore throat Negative: Fever, chills, fall, injury, chest pain/shortness of breath, cough, vomiting, diarrhea, dysuria/hematuria, extremity numbness/tingling, vision change or any additional concerns.  Physical Exam  BP (!) 161/125 (BP Location: Left Arm)   Pulse (!) 102   Temp 99.5 F (37.5 C) (Oral)   Resp 18   Ht 5\' 6"  (1.676 m)   Wt 111.6 kg   SpO2 98%   BMI 39.71 kg/m  Gen:   Awake, no distress   Resp:  Normal effort  MSK:   Moves extremities without difficulty, full ROM of the neck without pain. Other:  Mild posterior pharynx cobblestoning.  No significant tonsillar erythema or swelling.  Abdomen soft nontender.  Medical Decision Making  Medically screening exam initiated at 10:42 AM.  Appropriate orders placed.  KRISTIN LAMAGNA was informed that the remainder of the evaluation will be completed by another provider, this initial triage assessment does not replace that evaluation, and the importance of remaining in the ED until their evaluation is complete.  Patient well-appearing no acute distress, minimally tachycardic with rate 102 bpm.  Afebrile.  Patient does not appear septic.  Will obtain labs and patient will need to be seen in main ER. Fluids  ordered  Note: Portions of this report may have been transcribed using voice recognition software. Every effort was made to ensure accuracy; however, inadvertent computerized transcription errors may still be present.    Jake Michaelis, PA-C 04/13/22 1045

## 2022-04-13 NOTE — ED Provider Notes (Incomplete)
[name] Provider Note   CSN: [id number] Arrival date & time: 04/13/22  0907     History {Add pertinent medical, surgical, social history, OB history to HPI:1} Chief Complaint  Patient presents with  . Hyperglycemia    Aaron Willis is a 61 y.o. male.  The history is provided by the patient.  Hyperglycemia He has history of hypertension, newly diagnosed diabetes   Home Medications Prior to Admission medications   Medication Sig Start Date End Date Taking? Authorizing Provider  amoxicillin-clavulanate (AUGMENTIN) 875-125 MG tablet Take 1 tablet by mouth 2 (two) times daily. Take with food 12/30/21   Viviano Simas, FNP  fluticasone [facility]) 50 MCG/ACT nasal spray Place 2 sprays into both nostrils daily. 12/30/21   Viviano Simas, FNP  traMADol (ULTRAM) 50 MG tablet Take 50 mg by mouth every 6 (six) hours as needed for pain.    [provider]      Allergies    Patient has no known allergies.    Review of Systems   Review of Systems  All other systems reviewed and are negative.   Physical Exam Updated Vital Signs BP (!) 177/116   Pulse 74   Temp 97.8 F (36.6 C) (Oral)   Resp 19   Ht 5\' 6"  (1.676 m)   Wt 111.6 kg   SpO2 100%   BMI 39.71 kg/m  Physical Exam Vitals and nursing note reviewed.   61 year old male, resting comfortably and in no acute distress. Vital signs are ***. Oxygen saturation is ***%, which is normal. Head is normocephalic and atraumatic. PERRLA, EOMI. Oropharynx is clear. Neck is nontender and supple without adenopathy or JVD. Back is nontender and there is no CVA tenderness. Lungs are clear without rales, wheezes, or rhonchi. Chest is nontender. Heart has regular rate and rhythm without murmur. Abdomen is soft, flat, nontender without masses or hepatosplenomegaly and peristalsis is normoactive. Extremities have no cyanosis or edema, full range of motion is present. Skin is warm and dry without  rash. Neurologic: Mental status is normal, cranial nerves are intact, there are no motor or sensory deficits.  ED Results / Procedures / Treatments   Labs (all labs ordered are listed, but only abnormal results are displayed) Labs Reviewed  RESP PANEL BY RT-PCR (RSV, FLU A&B, COVID)  RVPGX2 - Abnormal; Notable for the following components:      Result Value   Influenza A by PCR POSITIVE (*)    All other components within normal limits  CBC WITH DIFFERENTIAL/PLATELET - Abnormal; Notable for the following components:   Hemoglobin 17.2 (*)    All other components within normal limits  BASIC METABOLIC PANEL - Abnormal; Notable for the following components:   Sodium 132 (*)    Chloride 94 (*)    Glucose, Bld 305 (*)    Creatinine, Ser 1.39 (*)    GFR, Estimated 58 (*)    All other components within normal limits  BETA-HYDROXYBUTYRIC ACID - Abnormal; Notable for the following components:   Beta-Hydroxybutyric Acid 0.51 (*)    All other components within normal limits  CBG MONITORING, ED - Abnormal; Notable for the following components:   Glucose-Capillary 337 (*)    All other components within normal limits  CBG MONITORING, ED - Abnormal; Notable for the following components:   Glucose-Capillary 278 (*)    All other components within normal limits  CBG MONITORING, ED - Abnormal; Notable for the following components:   Glucose-Capillary 236 (*)  All other components within normal limits  URINALYSIS, ROUTINE W REFLEX MICROSCOPIC    EKG EKG Interpretation  Date/Time:  Tuesday April 13 2022 10:50:25 EST Ventricular Rate:  102 PR Interval:  145 QRS Duration: 84 QT Interval:  329 QTC Calculation: 429 R Axis:   73 Text Interpretation: Sinus tachycardia Probable left atrial enlargement Anterior infarct, old When compared with ECG of 12/27/2012, HEART RATE has increased Confirmed by Dione Booze ([id number]) on 04/13/2022 11:53:36 PM  Radiology No results  found.  Procedures Procedures  {Document cardiac monitor, telemetry assessment procedure when appropriate:1}  Medications Ordered in ED Medications  sodium chloride 0.9 % bolus 1,000 mL (has no administration in time range)    ED Course/ Medical Decision Making/ A&P                           Medical Decision Making  ***  {Document critical care time when appropriate:1} {Document review of labs and clinical decision tools ie heart score, Chads2Vasc2 etc:1}  {Document your independent review of radiology images, and any outside records:1} {Document your discussion with family members, caretakers, and with consultants:1} {Document social determinants of health affecting pt's care:1} {Document your decision making why or why not admission, treatments were needed:1} Final Clinical Impression(s) / ED Diagnoses Final diagnoses:  None    Rx / DC Orders ED Discharge Orders     None

## 2022-04-14 MED ORDER — ONDANSETRON 8 MG PO TBDP: 8.0000 mg | ORAL_TABLET | Freq: Three times a day (TID) | ORAL | 0 refills | Status: DC | PRN

## 2022-04-14 NOTE — Discharge Instructions (Addendum)
Drink plenty of fluids.  You may take ibuprofen and/or acetaminophen as needed for fever or aching.  If you develop diarrhea, you may take loperamide (Imodium A-D).  Please check your blood pressure once a day and keep a record of it.  Take that record with you when you see your primary care provider.  They may need to make additional adjustments in your blood pressure medication.  Return to the emergency department if you have any new or concerning symptoms.

## 2023-03-05 ENCOUNTER — Other Ambulatory Visit: Payer: Self-pay

## 2023-03-05 ENCOUNTER — Emergency Department (HOSPITAL_COMMUNITY)
Admission: EM | Admit: 2023-03-05 | Discharge: 2023-03-06 | Disposition: A | Discharge status: Home / Self Care | Payer: No Typology Code available for payment source | Attending: Emergency Medicine | Admitting: Emergency Medicine

## 2023-03-05 DIAGNOSIS — Z79899 Other long term (current) drug therapy: Secondary | ICD-10-CM | POA: Diagnosis not present

## 2023-03-05 DIAGNOSIS — I10 Essential (primary) hypertension: Secondary | ICD-10-CM | POA: Diagnosis not present

## 2023-03-05 DIAGNOSIS — K0889 Other specified disorders of teeth and supporting structures: Secondary | ICD-10-CM | POA: Diagnosis present

## 2023-03-05 DIAGNOSIS — E119 Type 2 diabetes mellitus without complications: Secondary | ICD-10-CM | POA: Insufficient documentation

## 2023-03-05 NOTE — ED Triage Notes (Addendum)
Patient reports having a elevated blood pressure. States his BP went up after taking ibuprofen for a tooth ache. Normally BP is 140s. Tonight it was 220s. Patient takes losartan for BP daily. Denies headache, blurred vision. BP in triage is 251/99  Denies chest pain, no numbness or tingling. Only complains of tooth pain rated 7/10.

## 2023-03-06 ENCOUNTER — Emergency Department (HOSPITAL_COMMUNITY): Payer: No Typology Code available for payment source

## 2023-03-06 LAB — BASIC METABOLIC PANEL
Anion gap: 7 (ref 5–15)
BUN: 27 mg/dL — ABNORMAL HIGH (ref 8–23)
CO2: 25 mmol/L (ref 22–32)
Calcium: 9 mg/dL (ref 8.9–10.3)
Chloride: 103 mmol/L (ref 98–111)
Creatinine, Ser: 1.27 mg/dL — ABNORMAL HIGH (ref 0.61–1.24)
GFR, Estimated: >60 mL/min (ref >60)
Glucose, Bld: 121 mg/dL — ABNORMAL HIGH (ref 70–99)
Potassium: 4.2 mmol/L (ref 3.5–5.1)
Sodium: 135 mmol/L (ref 135–145)

## 2023-03-06 LAB — CBC WITH DIFFERENTIAL/PLATELET
Abs Immature Granulocytes: 0.04 10*3/uL (ref 0.00–0.07)
Basophils Absolute: 0.1 10*3/uL (ref 0.0–0.1)
Basophils Relative: 1 %
Eosinophils Absolute: 0.2 10*3/uL (ref 0.0–0.5)
Eosinophils Relative: 2 %
HCT: 43.9 % (ref 39.0–52.0)
Hemoglobin: 15.3 g/dL (ref 13.0–17.0)
Immature Granulocytes: 0 %
Lymphocytes Relative: 41 %
Lymphs Abs: 3.8 10*3/uL (ref 0.7–4.0)
MCH: 33.3 pg (ref 26.0–34.0)
MCHC: 34.9 g/dL (ref 30.0–36.0)
MCV: 95.6 fL (ref 80.0–100.0)
Monocytes Absolute: 0.9 10*3/uL (ref 0.1–1.0)
Monocytes Relative: 9 %
Neutro Abs: 4.3 10*3/uL (ref 1.7–7.7)
Neutrophils Relative %: 47 %
Platelets: 200 10*3/uL (ref 150–400)
RBC: 4.59 MIL/uL (ref 4.22–5.81)
RDW: 14.1 % (ref 11.5–15.5)
WBC: 9.3 10*3/uL (ref 4.0–10.5)
nRBC: 0 % (ref 0.0–0.2)

## 2023-03-06 LAB — URINALYSIS, ROUTINE W REFLEX MICROSCOPIC
Bacteria, UA: NONE SEEN
Bilirubin Urine: NEGATIVE
Glucose, UA: >=500 mg/dL — AB
Hgb urine dipstick: NEGATIVE
Ketones, ur: NEGATIVE mg/dL
Leukocytes,Ua: NEGATIVE
Nitrite: NEGATIVE
Protein, ur: NEGATIVE mg/dL
Specific Gravity, Urine: 1.017 (ref 1.005–1.030)
pH: 5 (ref 5.0–8.0)

## 2023-03-06 LAB — MAGNESIUM: Magnesium: 2.3 mg/dL (ref 1.7–2.4)

## 2023-03-06 MED ORDER — OXYCODONE-ACETAMINOPHEN 5-325 MG PO TABS
1.0000 | ORAL_TABLET | Freq: Once | ORAL | Status: AC
  Administered 2023-03-06: 1 via ORAL
  Filled 2023-03-06: qty 1

## 2023-03-06 MED ORDER — LOSARTAN POTASSIUM 25 MG PO TABS
100.0000 mg | ORAL_TABLET | Freq: Once | ORAL | Status: AC
  Administered 2023-03-06: 100 mg via ORAL
  Filled 2023-03-06: qty 4

## 2023-03-06 MED ORDER — BUPIVACAINE HCL 0.25 % IJ SOLN: 5.0000 mL | Freq: Once | INTRAMUSCULAR | Status: DC

## 2023-03-06 MED ORDER — AMOXICILLIN-POT CLAVULANATE 875-125 MG PO TABS: 1.0000 | ORAL_TABLET | Freq: Two times a day (BID) | ORAL | 0 refills | Status: DC

## 2023-03-06 MED ORDER — BUPIVACAINE HCL (PF) 0.25 % IJ SOLN
5.0000 mL | Freq: Once | INTRAMUSCULAR | Status: DC
  Filled 2023-03-06 (×2): qty 30

## 2023-03-06 NOTE — ED Provider Notes (Signed)
[place] EMERGENCY DEPARTMENT AT [facility] Provider Note   CSN: [id number] Arrival date & time: 03/05/23  2230     History  Chief Complaint  Patient presents with   Hypertension    Aaron Willis is a 62 y.o. male.   Hypertension  Patient presents for hypertension.  Medical history includes HTN, DM, HLD, BPH.  He states that blood pressures typically in the 140s SBP.  He took an ibuprofen for ongoing dental pain this afternoon.  Blood pressure was subsequently elevated in the range of 220s.  He denies any symptoms other than his tooth pain.  Current pain is 6/10 in severity.  Onset of tooth pain was this afternoon.  This is in the area of tooth that has been broken off.  He has a dentist appointment set up for later this month.  He takes losartan in the morning.  He did take his dose yesterday.     Home Medications Prior to Admission medications   Medication Sig Start Date End Date Taking? Authorizing Provider  amoxicillin-clavulanate (AUGMENTIN) 875-125 MG tablet Take 1 tablet by mouth every 12 (twelve) hours. 03/06/23  Yes Gloris Manchester, MD  atorvastatin (LIPITOR) 40 MG tablet Take 40 mg by mouth daily. 04/12/22 04/13/23 Yes [provider]  Empagliflozin-metFORMIN HCl ER 25-1000 MG TB24 Take 1 tablet by mouth daily. 12/07/22  Yes [provider]  fluticasone (FLONASE) 50 MCG/ACT nasal spray Place 2 sprays into both nostrils daily. Patient taking differently: Place 2 sprays into both nostrils daily as needed for allergies. 12/30/21  Yes Viviano Simas, FNP  losartan (COZAAR) 100 MG tablet Take 50 mg by mouth daily.   Yes [provider]  omeprazole (PRILOSEC) 20 MG capsule Take 1 capsule by mouth daily as needed. 10/13/19  Yes [provider]  sildenafil (VIAGRA) 100 MG tablet Take 100 mg by mouth as needed for erectile dysfunction. 02/11/23  Yes [provider]  vitamin B-12 (CYANOCOBALAMIN) 100 MCG tablet Take 100 mcg by  mouth daily.   Yes [provider]  ondansetron (ZOFRAN-ODT) 8 MG disintegrating tablet Take 1 tablet (8 mg total) by mouth every 8 (eight) hours as needed for nausea or vomiting. Patient not taking: Reported on 03/06/2023 04/14/22   Dione Booze, MD      Allergies    Patient has no known allergies.    Review of Systems   Review of Systems  HENT:  Positive for dental problem.   All other systems reviewed and are negative.   Physical Exam Updated Vital Signs BP (!) 163/89 (BP Location: Left Arm)   Pulse 66   Temp 98 F (36.7 C) (Oral)   Resp 16   Ht 5\' 6"  (1.676 m)   Wt 104.3 kg   SpO2 99%   BMI 37.12 kg/m  Physical Exam Vitals and nursing note reviewed.  Constitutional:      General: He is not in acute distress.    Appearance: Normal appearance. He is well-developed. He is not ill-appearing, toxic-appearing or diaphoretic.  HENT:     Head: Normocephalic and atraumatic.     Right Ear: External ear normal.     Left Ear: External ear normal.     Nose: Nose normal.     Mouth/Throat:     Mouth: Mucous membranes are moist.  Eyes:     Extraocular Movements: Extraocular movements intact.     Conjunctiva/sclera: Conjunctivae normal.  Cardiovascular:     Rate and Rhythm: Normal rate and regular  rhythm.  Pulmonary:     Effort: Pulmonary effort is normal. No respiratory distress.  Abdominal:     General: There is no distension.     Palpations: Abdomen is soft.     Tenderness: There is no abdominal tenderness.  Musculoskeletal:        General: No swelling. Normal range of motion.     Cervical back: Normal range of motion and neck supple.  Skin:    General: Skin is warm and dry.     Coloration: Skin is not jaundiced or pale.  Neurological:     General: No focal deficit present.     Mental Status: He is alert and oriented to person, place, and time.     Cranial Nerves: No cranial nerve deficit.     Sensory: No sensory deficit.     Motor: No weakness.      Coordination: Coordination normal.  Psychiatric:        Mood and Affect: Mood normal.        Behavior: Behavior normal.     ED Results / Procedures / Treatments   Labs (all labs ordered are listed, but only abnormal results are displayed) Labs Reviewed  BASIC METABOLIC PANEL - Abnormal; Notable for the following components:      Result Value   Glucose, Bld 121 (*)    BUN 27 (*)    Creatinine, Ser 1.27 (*)    All other components within normal limits  URINALYSIS, ROUTINE W REFLEX MICROSCOPIC - Abnormal; Notable for the following components:   Color, Urine STRAW (*)    Glucose, UA >=500 (*)    All other components within normal limits  MAGNESIUM  CBC WITH DIFFERENTIAL/PLATELET    EKG EKG Interpretation Date/Time:  Saturday March 05 2023 23:05:24 EST Ventricular Rate:  71 PR Interval:  164 QRS Duration:  85 QT Interval:  378 QTC Calculation: 411 R Axis:   20  Text Interpretation: Sinus rhythm Ventricular bigeminy Probable left atrial enlargement Confirmed by Gloris Manchester ([id number]) on 03/06/2023 3:21:08 AM  Radiology CT Head Wo Contrast  Result Date: 03/06/2023 CLINICAL DATA:  Syncope/presyncope, cerebrovascular cause suspected. Elevated blood pressure. EXAM: CT HEAD WITHOUT CONTRAST TECHNIQUE: Contiguous axial images were obtained from the base of the skull through the vertex without intravenous contrast. RADIATION DOSE REDUCTION: This exam was performed according to the departmental dose-optimization program which includes automated exposure control, adjustment of the mA and/or kV according to patient size and/or use of iterative reconstruction technique. COMPARISON:  None Available. FINDINGS: Brain: No acute intracranial hemorrhage, midline shift or mass effect. No extra-axial fluid collection. Gray-white matter differentiation is within normal limits. No hydrocephalus. Vascular: No hyperdense vessel or unexpected calcification. Skull: Normal. Negative for fracture or focal  lesion. Sinuses/Orbits: No acute finding. Other: None. IMPRESSION: No acute intracranial process. Electronically Signed   By: Thornell Sartorius M.D.   On: 03/06/2023 04:51   DG Chest Portable 1 View  Result Date: 03/06/2023 CLINICAL DATA:  Hypertension. EXAM: PORTABLE CHEST 1 VIEW COMPARISON:  12/27/2012. FINDINGS: The heart size and mediastinal contours are within normal limits. No consolidation, effusion, or pneumothorax. No acute osseous abnormality. IMPRESSION: No active disease. Electronically Signed   By: Thornell Sartorius M.D.   On: 03/06/2023 04:41    Procedures Procedures    Medications Ordered in ED Medications  bupivacaine (PF) (MARCAINE) 0.25 % injection 5 mL (has no administration in time range)  oxyCODONE-acetaminophen (PERCOCET/ROXICET) 5-325 MG per tablet 1 tablet (1 tablet Oral Given 03/06/23 0403)  losartan (COZAAR) tablet 100 mg (100 mg Oral Given 03/06/23 0403)    ED Course/ Medical Decision Making/ A&P                                 Medical Decision Making Amount and/or Complexity of Data Reviewed Labs: ordered. Radiology: ordered.  Risk Prescription drug management.   This patient presents to the ED for concern of hypertension, this involves an extensive number of treatment options, and is a complaint that carries with it a high risk of complications and morbidity.  The differential diagnosis includes hypertensive crisis, pain induced hypertension, medication nonadherence   Co morbidities that complicate the patient evaluation  HTN, DM, HLD, BPH   Additional history obtained:  Additional history obtained from N/A External records from outside source obtained and reviewed including EMR   Lab Tests:  I Ordered, and personally interpreted labs.  The pertinent results include: Normal hemoglobin, no leukocytosis, baseline creatinine, normal electrolytes   Imaging Studies ordered:  I ordered imaging studies including chest x-ray, CT head I independently  visualized and interpreted imaging which showed no acute findings I agree with the radiologist interpretation   Cardiac Monitoring: / EKG:  The patient was maintained on a cardiac monitor.  I personally viewed and interpreted the cardiac monitored which showed an underlying rhythm of: Sinus rhythm   Problem List / ED Course / Critical interventions / Medication management  Patient presents for hypertension.  Blood pressure on arrival was in the range of 250/100.  He does take losartan and typically takes in the morning, including yesterday morning.  While in ED waiting room, patient's blood pressure improved.  On initial assessment, blood pressure is now in the range of 165 SBP.  He endorses ongoing sick/10 severity tooth pain.  Elevation in blood pressure may have been secondary to pain.  Dose of Percocet was ordered for analgesia.  Patient's home dose of losartan was ordered.  Workup was initiated to assess for possible endorgan damage.  Lab work and imaging studies are reassuring.  Patient's blood pressure improved to 140 SBP.  Initial plan was to provide dental block for his painful tooth, however, his pain completely resolved after single dose of Percocet.  Patient was discharged in good condition. I ordered medication including Percocet for analgesia; Cozaar for HTN Reevaluation of the patient after these medicines showed that the patient resolved I have reviewed the patients home medicines and have made adjustments as needed   Social Determinants of Health:  Has access to outpatient care through the [facility]         Final Clinical Impression(s) / ED Diagnoses Final diagnoses:  Hypertension, unspecified type    Rx / DC Orders ED Discharge Orders          Ordered    amoxicillin-clavulanate (AUGMENTIN) 875-125 MG tablet  Every 12 hours        03/06/23 0548              Gloris Manchester, MD 03/06/23 [phone]

## 2023-03-06 NOTE — Discharge Instructions (Addendum)
You likely have infected pulp on that painful tooth.  You will need to see your dentist as soon as possible.  In the meantime, a prescription for an antibiotic was sent to your pharmacy.  Continue other home medications as prescribed.   Return to the emergency department for any new or worsening symptoms of concern.

## 2024-03-20 ENCOUNTER — Other Ambulatory Visit: Payer: Self-pay

## 2024-03-20 ENCOUNTER — Emergency Department (HOSPITAL_COMMUNITY)

## 2024-03-20 ENCOUNTER — Inpatient Hospital Stay (HOSPITAL_COMMUNITY)
Admission: EM | Admit: 2024-03-20 | Discharge: 2024-03-30 | DRG: 841 | Disposition: A | Attending: Internal Medicine | Admitting: Internal Medicine

## 2024-03-20 DIAGNOSIS — M898X5 Other specified disorders of bone, thigh: Secondary | ICD-10-CM

## 2024-03-20 DIAGNOSIS — M25539 Pain in unspecified wrist: Secondary | ICD-10-CM | POA: Diagnosis present

## 2024-03-20 DIAGNOSIS — L03113 Cellulitis of right upper limb: Secondary | ICD-10-CM

## 2024-03-20 DIAGNOSIS — A419 Sepsis, unspecified organism: Principal | ICD-10-CM

## 2024-03-20 DIAGNOSIS — M25531 Pain in right wrist: Secondary | ICD-10-CM | POA: Insufficient documentation

## 2024-03-20 DIAGNOSIS — R52 Pain, unspecified: Secondary | ICD-10-CM

## 2024-03-20 LAB — CBC WITH DIFFERENTIAL/PLATELET
Abs Immature Granulocytes: 0.04 K/uL (ref 0.00–0.07)
Abs Immature Granulocytes: 0.06 K/uL (ref 0.00–0.07)
Basophils Absolute: 0.1 K/uL (ref 0.0–0.1)
Basophils Absolute: 0 K/uL (ref 0.0–0.1)
Basophils Relative: 1 %
Basophils Relative: 0 %
Eosinophils Absolute: 0 K/uL (ref 0.0–0.5)
Eosinophils Absolute: 0 K/uL (ref 0.0–0.5)
Eosinophils Relative: 0 %
Eosinophils Relative: 0 %
HCT: 45.3 % (ref 39.0–52.0)
HCT: 43.3 % (ref 39.0–52.0)
Hemoglobin: 15.6 g/dL (ref 13.0–17.0)
Hemoglobin: 15 g/dL (ref 13.0–17.0)
Immature Granulocytes: 0 %
Immature Granulocytes: 1 %
Lymphocytes Relative: 11 %
Lymphocytes Relative: 9 %
Lymphs Abs: 1.1 K/uL (ref 0.7–4.0)
Lymphs Abs: 1.1 K/uL (ref 0.7–4.0)
MCH: 33.5 pg (ref 26.0–34.0)
MCH: 33.3 pg (ref 26.0–34.0)
MCHC: 34.4 g/dL (ref 30.0–36.0)
MCHC: 34.6 g/dL (ref 30.0–36.0)
MCV: 97.2 fL (ref 80.0–100.0)
MCV: 96.2 fL (ref 80.0–100.0)
Monocytes Absolute: 0.8 K/uL (ref 0.1–1.0)
Monocytes Absolute: 1.2 K/uL — ABNORMAL HIGH (ref 0.1–1.0)
Monocytes Relative: 8 %
Monocytes Relative: 10 %
Neutro Abs: 7.8 K/uL — ABNORMAL HIGH (ref 1.7–7.7)
Neutro Abs: 9.9 K/uL — ABNORMAL HIGH (ref 1.7–7.7)
Neutrophils Relative %: 80 %
Neutrophils Relative %: 80 %
Platelets: 201 K/uL (ref 150–400)
Platelets: 196 K/uL (ref 150–400)
RBC: 4.66 MIL/uL (ref 4.22–5.81)
RBC: 4.5 MIL/uL (ref 4.22–5.81)
RDW: 14.4 % (ref 11.5–15.5)
RDW: 14.5 % (ref 11.5–15.5)
WBC: 9.8 K/uL (ref 4.0–10.5)
WBC: 12.3 K/uL — ABNORMAL HIGH (ref 4.0–10.5)
nRBC: 0 % (ref 0.0–0.2)
nRBC: 0 % (ref 0.0–0.2)

## 2024-03-20 LAB — BASIC METABOLIC PANEL WITH GFR
Anion gap: 13 (ref 5–15)
BUN: 17 mg/dL (ref 8–23)
CO2: 26 mmol/L (ref 22–32)
Calcium: 9.8 mg/dL (ref 8.9–10.3)
Chloride: 98 mmol/L (ref 98–111)
Creatinine, Ser: 1.31 mg/dL — ABNORMAL HIGH (ref 0.61–1.24)
GFR, Estimated: >60 mL/min (ref >60)
Glucose, Bld: 108 mg/dL — ABNORMAL HIGH (ref 70–99)
Potassium: 4 mmol/L (ref 3.5–5.1)
Sodium: 137 mmol/L (ref 135–145)

## 2024-03-20 LAB — SEDIMENTATION RATE: Sed Rate: 46 mm/h — ABNORMAL HIGH (ref 0–16)

## 2024-03-20 LAB — I-STAT CG4 LACTIC ACID, ED
Lactic Acid, Venous: 1.7 mmol/L (ref 0.5–1.9)
Lactic Acid, Venous: 2.2 mmol/L (ref 0.5–1.9)

## 2024-03-20 LAB — URIC ACID: Uric Acid, Serum: 5.6 mg/dL (ref 3.7–8.6)

## 2024-03-20 MED ORDER — PREDNISONE 20 MG PO TABS
60.0000 mg | ORAL_TABLET | Freq: Once | ORAL | Status: AC
  Administered 2024-03-20: 60 mg via ORAL
  Filled 2024-03-20: qty 3

## 2024-03-20 MED ORDER — LACTATED RINGERS IV BOLUS (SEPSIS)
1000.0000 mL | Freq: Once | INTRAVENOUS | Status: AC
  Administered 2024-03-21: 1000 mL via INTRAVENOUS

## 2024-03-20 MED ORDER — VANCOMYCIN HCL IN DEXTROSE 1-5 GM/200ML-% IV SOLN
1000.0000 mg | Freq: Once | INTRAVENOUS | Status: AC
  Administered 2024-03-21: 1000 mg via INTRAVENOUS
  Filled 2024-03-20: qty 200

## 2024-03-20 MED ORDER — OXYCODONE-ACETAMINOPHEN 5-325 MG PO TABS
2.0000 | ORAL_TABLET | Freq: Once | ORAL | Status: AC
  Administered 2024-03-20: 2 via ORAL
  Filled 2024-03-20: qty 2

## 2024-03-20 MED ORDER — SODIUM CHLORIDE 0.9 % IV SOLN
2.0000 g | Freq: Once | INTRAVENOUS | Status: AC
  Administered 2024-03-20: 2 g via INTRAVENOUS
  Filled 2024-03-20: qty 12.5

## 2024-03-20 MED ORDER — HYDROMORPHONE HCL 1 MG/ML IJ SOLN
0.5000 mg | Freq: Once | INTRAMUSCULAR | Status: AC
  Administered 2024-03-20: 0.5 mg via INTRAVENOUS
  Filled 2024-03-20: qty 1

## 2024-03-20 MED ORDER — ACETAMINOPHEN 500 MG PO TABS
1000.0000 mg | ORAL_TABLET | Freq: Once | ORAL | Status: AC
  Administered 2024-03-20: 1000 mg via ORAL
  Filled 2024-03-20: qty 2

## 2024-03-20 MED ORDER — LACTATED RINGERS IV SOLN: INTRAVENOUS | Status: DC

## 2024-03-20 MED ORDER — KETOROLAC TROMETHAMINE 15 MG/ML IJ SOLN
15.0000 mg | Freq: Once | INTRAMUSCULAR | Status: AC
  Administered 2024-03-20: 15 mg via INTRAVENOUS
  Filled 2024-03-20: qty 1

## 2024-03-20 MED ORDER — METRONIDAZOLE 500 MG/100ML IV SOLN
500.0000 mg | Freq: Once | INTRAVENOUS | Status: AC
  Administered 2024-03-20: 500 mg via INTRAVENOUS
  Filled 2024-03-20: qty 100

## 2024-03-20 MED ORDER — LORAZEPAM 2 MG/ML IJ SOLN: 1.0000 mg | Freq: Once | INTRAMUSCULAR | Status: DC | PRN

## 2024-03-20 MED ORDER — GADOBUTROL 1 MMOL/ML IV SOLN
10.0000 mL | Freq: Once | INTRAVENOUS | Status: AC | PRN
  Administered 2024-03-20: 10 mL via INTRAVENOUS

## 2024-03-20 MED ORDER — LACTATED RINGERS IV BOLUS
1000.0000 mL | Freq: Once | INTRAVENOUS | Status: AC
  Administered 2024-03-20: 1000 mL via INTRAVENOUS

## 2024-03-20 NOTE — ED Provider Notes (Signed)
  [place] EMERGENCY DEPARTMENT AT [facility] Provider Assume Care Note I assumed care of Aaron Willis on 03/20/2024 at 3 PM from Dr. Ismael.   Briefly, Aaron Willis is a 63 y.o. male who: PMHx: None pertinent P/w acute right wrist pain, subacute right hip pain Prior writer feels that gout and septic arthritis unlikely, feels that patient was likely has a tendinopathy of the wrist given focal pain.  However, on workup of the hip pain, patient was found to have lytic lesions, is pending MRI  Plan at the time of handoff: Follow-up MRI of the hip   Please refer to the original provider's note for additional information regarding the care of Aaron Willis.  Reassessment: I personally reassessed the patient: Patient without any acute complaints or additional questions.  Vital Signs:  ED Triage Vitals  Encounter Vitals Group     BP 03/20/24 1046 (!) 147/81     Girls Systolic BP Percentile --      Girls Diastolic BP Percentile --      Boys Systolic BP Percentile --      Boys Diastolic BP Percentile --      Pulse Rate 03/20/24 1046 88     Resp 03/20/24 1046 18     Temp 03/20/24 1046 98.5 F (36.9 C)     Temp Source 03/20/24 1046 Oral     SpO2 03/20/24 1046 100 %     Weight --      Height --      Head Circumference --      Peak Flow --      Pain Score 03/20/24 1229 7     Pain Loc --      Pain Education --      Exclude from Growth Chart --      Hemodynamics:  The patient is hemodynamically stable. Mental Status:  The patient is alert  Additional MDM: MRI of the hip obtained and does demonstrate multiple lytic lesions, concerning for possible malignancy and/or infectious etiology.  Patient was evaluated at bedside, and these possibilities were discussed with him as well as his partner.  Interestingly, patient did develop a fever by the time of my reevaluation as well as tachycardia.  Patient does not have any other localizing symptoms of infection, therefore  I do have some concern for the patient's osseous erosions being of an infectious etiology rather than a malignancy.  Workup broadened with lactic acid, blood cultures drawn.  Patient given LR bolus.  Initial lactic acid assuring, however patient persistently tachycardic, persistently febrile despite antipyretics, repeat lactic acid obtained and uptrending, thus additional fluids administered, however patient still continues to have uptrending lactic acid.  Thus patient given broad-spectrum antibiotics, do feel that patient warrants admission for further workup of sepsis.  Hospitalist consulted for admission, discussed with Dr. Dena who accepted the patient to his service.  Disposition: ADMIT: I believe the patient requires admission for further care and management. The patient was admitted to hospitalists. Please see inpatient provider note for additional treatment plan details.    Aaron Willis Later, MD Emergency Medicine    Later Aaron RAMAN, MD 03/27/24 [phone]

## 2024-03-20 NOTE — ED Triage Notes (Signed)
 Pt arrives via wheelchair with complaints of RIGHT thigh pain and RIGHT arm swelling. PT states that he has been stretching and using ice with no improvement of sx.

## 2024-03-20 NOTE — Sepsis Progress Note (Signed)
 Elink monitoring for the code sepsis protocol.

## 2024-03-20 NOTE — ED Provider Notes (Signed)
 [facility] EMERGENCY DEPARTMENT AT [facility] Provider Note   CSN: [id number] Arrival date & time: 03/20/24  1038     Patient presents with: Arm Swelling and Leg Pain   Aaron Willis is a 63 y.o. male.  {Add pertinent medical, surgical, social history, OB history to HPI:32947} HPI Presents for right wrist and lower extremity pain.  Right wrist pain started last night.  This is a more recent development.  Patient reports that he has severe pain and overnight developed swelling and visible pulsation of his pulse on the right.  The pain is more local to the thumb side of the wrist.  No injury that he is aware of.  Patient denies any prior episode of similar pain or swelling in the joint.  He reports is exquisitely painful to move the wrist.  He does not have numbness or tingling into the fingers.  He does not have pain into the fingers or the hand.  Patient denies any pain of the forearm, elbow, upper arm or shoulder.  Hide some Exer strength Tylenol  without really much relief.  Patient also presents with right thigh and groin pain.  This has been a more indolent process.  Well over a month ago he started getting pain in the groin, upper thigh and somewhat in the lower back on the right.  Patient reports initially he did not think too much about it and thought it would just get better on his own.  Patient has history of femur fracture with rod repair 10 years ago.  He did not have subsequent chronic problems.  Over the course of the past month symptoms have worsened to the point where now the patient reports that he also has weakness and difficulty walking.  He denies any bowel or bladder dysfunction.  Patient had been seen for this at the [facility] and told he had cyst.  He denies any specific diagnostic evaluation or treatment plan.    Prior to Admission medications   Medication Sig Start Date End Date Taking? Authorizing Provider  amoxicillin -clavulanate (AUGMENTIN ) 875-125 MG  tablet Take 1 tablet by mouth every 12 (twelve) hours. 03/06/23   Melvenia Motto, MD  atorvastatin  (LIPITOR) 40 MG tablet Take 40 mg by mouth daily. 04/12/22 04/13/23  [provider]  Empagliflozin-metFORMIN HCl ER 25-1000 MG TB24 Take 1 tablet by mouth daily. 12/07/22   [provider]  fluticasone  (FLONASE ) 50 MCG/ACT nasal spray Place 2 sprays into both nostrils daily. Patient taking differently: Place 2 sprays into both nostrils daily as needed for allergies. 12/30/21   Kennyth Domino, FNP  losartan  (COZAAR ) 100 MG tablet Take 50 mg by mouth daily.    [provider]  omeprazole (PRILOSEC) 20 MG capsule Take 1 capsule by mouth daily as needed. 10/13/19   [provider]  ondansetron  (ZOFRAN -ODT) 8 MG disintegrating tablet Take 1 tablet (8 mg total) by mouth every 8 (eight) hours as needed for nausea or vomiting. Patient not taking: Reported on 03/06/2023 04/14/22   Raford Lenis, MD  sildenafil (VIAGRA) 100 MG tablet Take 100 mg by mouth as needed for erectile dysfunction. 02/11/23   [provider]  vitamin B-12 (CYANOCOBALAMIN) 100 MCG tablet Take 100 mcg by mouth daily.    [provider]    Allergies: Ibuprofen    Review of Systems  Updated Vital Signs BP (!) 154/82   Pulse 95   Temp 99.2 F (37.3 C) (Oral)   Resp 17   SpO2 95%  Physical Exam Constitutional:      Comments: Alert nontoxic clear mental status.  No respiratory distress.  HENT:     Mouth/Throat:     Pharynx: Oropharynx is clear.  Eyes:     Extraocular Movements: Extraocular movements intact.  Cardiovascular:     Rate and Rhythm: Normal rate and regular rhythm.  Pulmonary:     Effort: Pulmonary effort is normal.     Breath sounds: Normal breath sounds.  Abdominal:     General: There is no distension.     Palpations: Abdomen is soft.     Comments: Is soft without guarding.  Patient endorses some mild discomfort to the deep right lower quadrant above the  inguinal canal.  No appreciable mass or fullness.  Palpation of the inguinal region is normal without mass fullness, rash or induration.  Musculoskeletal:     Comments: Right wrist is mildly swollen more to the ulnar aspect.  Patient has significant pain in the ulnar wrist complex area lateral to the carpal tunnel and focusing over the base of the thumb and snuffbox area.  Mildly erythematous.  Radial pulse is prominent.  There is appreciable pulsation visibly of the radial pulse.  Patient denies pain to palpation of the palm, dorsum of the hand, metacarpals or fingers.  Remainder of the hand is warm and dry to touch.  Cap refill is brisk.  Patient denies feeling of numbness or tingling to the fingers.  No pain to compression of the forearm, elbow or shoulder.  No pain with range of motion at the elbow or the shoulder.  Right lower extremity does not have any peripheral edema.  Calf is soft and pliable.  Dorsalis pedis pulses easily palpable and 2+.  No visible or palpable soft tissue abnormalities of the upper thigh.  Patient does describe the pain is coming around the thigh and coming somewhat to the medial thigh and lateral.  Skin:    General: Skin is warm and dry.  Neurological:     Comments: Alert clear mental status.  No focal motor deficits.  Can elevate his right lower extremity off of the stretcher and hold.  He does have intact dorsal plantarflexion.  However endorses weakness for walking or weightbearing.  Psychiatric:        Mood and Affect: Mood normal.     (all labs ordered are listed, but only abnormal results are displayed) Labs Reviewed  BASIC METABOLIC PANEL WITH GFR - Abnormal; Notable for the following components:      Result Value   Glucose, Bld 108 (*)    Creatinine, Ser 1.31 (*)    All other components within normal limits  CBC WITH DIFFERENTIAL/PLATELET - Abnormal; Notable for the following components:   Neutro Abs 7.8 (*)    All other components within normal limits   SEDIMENTATION RATE - Abnormal; Notable for the following components:   Sed Rate 46 (*)    All other components within normal limits  URIC ACID    EKG: None  Radiology: DG Femur Min 2 Views Right Result Date: 03/20/2024 EXAM: 2 VIEW(S) XRAY OF THE FEMUR 03/20/2024 01:05:00 PM COMPARISON: 03/29/2010 CLINICAL HISTORY: hip pain/ h/o femur fracture with hardware FINDINGS: BONES AND JOINTS: Intramedullary rod and screws in place traversing healed fracture deformity of mid femoral diaphysis, with single proximal and 2 distal interlocking screws. Demineralization of the upper medial acetabulum, underlying lytic lesion not excluded. This is further discussed in the hip/pelvis radiograph report. No joint dislocation. SOFT TISSUES: The  soft tissues are unremarkable. IMPRESSION: 1. Demineralization of the upper medial right acetabulum with possible underlying lytic lesion; refer to today's hip/pelvis radiograph report for further discussion. 2. Intramedullary nail traversing healed mid femoral diaphyseal fracture, unchanged hardware alignment. Electronically signed by: Ryan Salvage MD 03/20/2024 02:46 PM EST RP Workstation: [id number]   DG Hip Unilat With Pelvis 2-3 Views Right Result Date: 03/20/2024 EXAM: 2 or more VIEW(S) XRAY OF THE HIP 03/20/2024 01:05:00 PM COMPARISON: 03/29/2010 CLINICAL HISTORY: hip pain/ h/o femur fracture with hardware FINDINGS: BONES AND JOINTS: Retrograde intramedullary nail in the right femur. Abnormal 2.1 cm lucency in the right upper medial acetabulum with demineralization of the cortical margin along the acetabulum concerning for a possible lytic lesion. Lytic osseous metastatic disease or myeloma not excluded. Dedicated MRI of the bony pelvis is recommended for more complete assessment. Mild degenerative chondral thinning in both hips. The hip joint is maintained. SOFT TISSUES: The soft tissues are unremarkable. IMPRESSION: 1. Abnormal 2.1 cm lucency in the right upper  medial acetabulum with cortical demineralization, concerning for a lytic lesion; lytic osseous metastatic disease or myeloma not excluded, recommend dedicated MRI of the bony pelvis for further assessment. 2. Mild degenerative chondral thinning in both hips. Electronically signed by: Ryan Salvage MD 03/20/2024 02:43 PM EST RP Workstation: [id number]   DG Wrist Complete Right Result Date: 03/20/2024 EXAM: 3 OR MORE VIEW(S) XRAY OF THE WRIST 03/20/2024 12:16:00 PM COMPARISON: None available. CLINICAL HISTORY: pain swelling FINDINGS: BONES AND JOINTS: No acute fracture. No focal osseous lesion. No joint dislocation. SOFT TISSUES: Mild soft tissue swelling. IMPRESSION: 1. Mild soft tissue swelling. 2. No acute fracture or dislocation. Electronically signed by: Donnice Mania MD 03/20/2024 12:49 PM EST RP Workstation: [id number]    {Document cardiac monitor, telemetry assessment procedure when appropriate:32947} Procedures   Medications Ordered in the ED  LORazepam  (ATIVAN ) injection 1 mg (has no administration in time range)  predniSONE  (DELTASONE ) tablet 60 mg (has no administration in time range)  HYDROmorphone  (DILAUDID ) injection 0.5 mg (has no administration in time range)  oxyCODONE -acetaminophen  (PERCOCET/ROXICET) 5-325 MG per tablet 2 tablet (2 tablets Oral Given 03/20/24 1229)      {Click here for ABCD2, HEART and other calculators REFRESH Note before signing:1}                              Medical Decision Making Amount and/or Complexity of Data Reviewed Labs: ordered. Radiology: ordered.  Risk Prescription drug management.   Patient presents as outlined.  It does appear that the wrist and the lower extremity complaint are 2 separate issues.  History and arm exam focuses to the wrist to the ulnar side most suggestive of an inflammatory episode or possibly septic joint although seems less likely as is focal to the ulnar aspect and not generally involving the entirety of the  wrist.  Interestingly, patient does have a visible pulsation of the radial pulse but I do think this is secondary to inflammation of the wrist.  There is no indication of vascular compromise.  He does not have paresthesia numbness or hand symptoms.  Will proceed with x-rays and lab work.  Regarding the right lower extremity complaint.  This has been a more indolent process.  Is more suggestive of radiculopathy and now weakness.  Given this progression and the report of weakness impairing ambulation will proceed with MRI.  Patient reports distant history of femur fracture with hardware placement.  By physical exam  and history it seems less likely that this is a hardware malfunction.  Will obtain x-ray imaging as well.  Xray imaging of the pelvis by radiology suggests a possible lytic lesion of the right acetabulum and recommend addition of pelvis MRI.  This has been added.  White count is normal.  Patient is afebrile.  The wrist redness and swelling is to the ulnar aspect more consistent with a tendinitis or synovitis.  I have lower suspicion for any infectious etiology.  Patient has been given prednisone  60 mg orally.  Percocet 2 tablets p.o.  MRI imaging is pending.  Dr. Rogelia to review MR imaging for final disposition.  If no other emergent interventions are needed, patient does have follow-up at the [facility] for his back and hip pain tomorrow.  There are no surgical urgent or emergent conditions, plan will be for the patient to get discharged with prednisone  taper and Percocet.  Patient will need close follow-up with orthopedics for both wrist and hip pain.  I do think these are 2 independent issues.  {Document critical care time when appropriate  Document review of labs and clinical decision tools ie CHADS2VASC2, etc  Document your independent review of radiology images and any outside records  Document your discussion with family members, caretakers and with consultants  Document social determinants of  health affecting pt's care  Document your decision making why or why not admission, treatments were needed:32947:::1}   Final diagnoses:  None    ED Discharge Orders     None

## 2024-03-20 NOTE — ED Notes (Addendum)
 Pt has had a cup of cold water will try in to obtain temp

## 2024-03-21 ENCOUNTER — Inpatient Hospital Stay (HOSPITAL_COMMUNITY)

## 2024-03-21 ENCOUNTER — Encounter (HOSPITAL_COMMUNITY): Payer: Self-pay | Admitting: Internal Medicine

## 2024-03-21 DIAGNOSIS — M25531 Pain in right wrist: Secondary | ICD-10-CM | POA: Diagnosis not present

## 2024-03-21 DIAGNOSIS — M25539 Pain in unspecified wrist: Secondary | ICD-10-CM | POA: Diagnosis present

## 2024-03-21 LAB — I-STAT CG4 LACTIC ACID, ED: Lactic Acid, Venous: 2.5 mmol/L (ref 0.5–1.9)

## 2024-03-21 MED ORDER — ENOXAPARIN SODIUM 40 MG/0.4ML IJ SOSY
40.0000 mg | PREFILLED_SYRINGE | INTRAMUSCULAR | Status: DC
  Administered 2024-03-21 – 2024-03-29 (×7): 40 mg via SUBCUTANEOUS
  Filled 2024-03-21 (×7): qty 0.4

## 2024-03-21 MED ORDER — MORPHINE SULFATE (PF) 2 MG/ML IV SOLN
2.0000 mg | INTRAVENOUS | Status: DC | PRN
  Administered 2024-03-22: 2 mg via INTRAVENOUS
  Filled 2024-03-21: qty 1

## 2024-03-21 MED ORDER — OXYCODONE-ACETAMINOPHEN 5-325 MG PO TABS
1.0000 | ORAL_TABLET | Freq: Once | ORAL | Status: AC
  Administered 2024-03-21: 1 via ORAL
  Filled 2024-03-21: qty 1

## 2024-03-21 MED ORDER — ATORVASTATIN CALCIUM 20 MG PO TABS
40.0000 mg | ORAL_TABLET | Freq: Every day | ORAL | Status: DC
  Administered 2024-03-21 – 2024-03-30 (×10): 40 mg via ORAL
  Filled 2024-03-21 (×10): qty 2

## 2024-03-21 MED ORDER — IOHEXOL 300 MG/ML  SOLN
100.0000 mL | Freq: Once | INTRAMUSCULAR | Status: AC | PRN
  Administered 2024-03-21: 100 mL via INTRAVENOUS

## 2024-03-21 MED ORDER — LACTATED RINGERS IV BOLUS: 1000.0000 mL | Freq: Once | INTRAVENOUS | Status: DC

## 2024-03-21 MED ORDER — OXYCODONE HCL 5 MG PO TABS
5.0000 mg | ORAL_TABLET | ORAL | Status: DC | PRN
  Administered 2024-03-21 – 2024-03-22 (×5): 5 mg via ORAL
  Filled 2024-03-21 (×5): qty 1

## 2024-03-21 MED ORDER — LOSARTAN POTASSIUM 50 MG PO TABS
50.0000 mg | ORAL_TABLET | Freq: Every day | ORAL | Status: DC
  Administered 2024-03-21 – 2024-03-28 (×8): 50 mg via ORAL
  Filled 2024-03-21 (×8): qty 1

## 2024-03-21 MED ORDER — ACETAMINOPHEN 500 MG PO TABS
1000.0000 mg | ORAL_TABLET | ORAL | Status: DC | PRN
  Administered 2024-03-21 (×3): 1000 mg via ORAL
  Administered 2024-03-21: 500 mg via ORAL
  Administered 2024-03-22 – 2024-03-24 (×2): 1000 mg via ORAL
  Filled 2024-03-21 (×7): qty 2

## 2024-03-21 NOTE — H&P (Signed)
 History and Physical    Aaron Willis FMW:[id number] DOB: 1960-05-06 DOA: 03/20/2024  PCP: [facility]   Chief Complaint:  wrist, hip p ain  HPI: Aaron Willis is a 63 y.o. male with medical history significant of hypertension, use who presented to the emergency department with right hip pain and arm pain.  Patient is been having severe pain last 24 hours that he thought was due to muscle tightness.  He has been stretching however his symptoms did not improve.  He had no neurologic deficits on that side.  He presented to the ER where he was found to be afebrile and hemodynamically stable.  Labs were obtained on presentation which showed creatinine 1.3 at baseline, CMP unrevealing, ESR 46, uric acid 5.6, WBC 12.3, lactic acid within normal limits.  Patient had MR pelvis which showed aggressive lytic mass in right acetabulum concerning for metastatic disease versus myeloma.  MRI lumbar showed central canal stenosis.  X-ray femur showed previous nailing.  X-ray of wrist showed no acute findings.  Patient was given IV fluids admitted for further workup.  He was endorsing persistent pain.   Review of Systems: Review of Systems  Constitutional: Negative.   HENT: Negative.    Eyes: Negative.   Respiratory: Negative.    Cardiovascular: Negative.   Gastrointestinal: Negative.   Genitourinary: Negative.   Musculoskeletal: Negative.   Skin: Negative.   Neurological: Negative.   Endo/Heme/Allergies: Negative.   Psychiatric/Behavioral: Negative.       As per HPI otherwise 10 point review of systems negative.   Allergies  Allergen Reactions   Ibuprofen Other (See Comments)    It makes my blood pressure go way up     Past Medical History:  Diagnosis Date   Diabetes mellitus without complication (HCC)    Hypertension     Past Surgical History:  Procedure Laterality Date   FEMUR FRACTURE SURGERY Right    KNEE SURGERY Left      reports that he has never smoked. He does not  have any smokeless tobacco history on file. He reports current alcohol use of about 1.0 standard drink of alcohol per week. He reports that he does not use drugs.  Family History  Problem Relation Age of Onset   Breast cancer Mother     Prior to Admission medications   Medication Sig Start Date End Date Taking? Authorizing Provider  acetaminophen  (TYLENOL ) 500 MG tablet Take 500 mg by mouth every 6 (six) hours as needed.   Yes [provider]  atorvastatin  (LIPITOR) 40 MG tablet Take 40 mg by mouth daily.   Yes [provider]  Empagliflozin-metFORMIN HCl ER 25-1000 MG TB24 Take 1 tablet by mouth daily. 12/07/22  Yes [provider]  losartan  (COZAAR ) 100 MG tablet Take 50 mg by mouth daily.   Yes [provider]  amoxicillin -clavulanate (AUGMENTIN ) 875-125 MG tablet Take 1 tablet by mouth every 12 (twelve) hours. Patient not taking: Reported on 03/21/2024 03/06/23   Melvenia Motto, MD  fluticasone  (FLONASE ) 50 MCG/ACT nasal spray Place 2 sprays into both nostrils daily. Patient taking differently: Place 2 sprays into both nostrils daily as needed for allergies. 12/30/21   Kennyth Domino, FNP  ondansetron  (ZOFRAN -ODT) 8 MG disintegrating tablet Take 1 tablet (8 mg total) by mouth every 8 (eight) hours as needed for nausea or vomiting. Patient not taking: Reported on 03/06/2023 04/14/22   Raford Lenis, MD    Physical Exam: Vitals:   03/21/24 0015 03/21/24 0035 03/21/24  0036 03/21/24 0039  BP:   (!) 153/93 (!) 153/93  Pulse: 96  95 97  Resp:    18  Temp:  98.9 F (37.2 C)    TempSrc:  Oral  Oral  SpO2: 97%  98% 100%   Physical Exam Vitals reviewed.  Constitutional:      Appearance: He is normal weight.  HENT:     Head: Normocephalic.     Mouth/Throat:     Mouth: Mucous membranes are moist.     Pharynx: Oropharynx is clear.  Eyes:     Conjunctiva/sclera: Conjunctivae normal.     Pupils: Pupils are equal, round, and reactive to light.   Cardiovascular:     Rate and Rhythm: Normal rate and regular rhythm.     Pulses: Normal pulses.     Heart sounds: Normal heart sounds.  Pulmonary:     Effort: Pulmonary effort is normal.     Breath sounds: Normal breath sounds.  Abdominal:     General: Abdomen is flat. Bowel sounds are normal.     Palpations: Abdomen is soft.  Musculoskeletal:        General: Normal range of motion.     Cervical back: Normal range of motion.  Skin:    General: Skin is warm.     Capillary Refill: Capillary refill takes less than 2 seconds.  Neurological:     General: No focal deficit present.     Mental Status: He is alert. Mental status is at baseline.  Psychiatric:        Mood and Affect: Mood normal.        Labs on Admission: I have personally reviewed the patients's labs and imaging studies.  Assessment/Plan Principal Problem:   Wrist pain   # Right hip pain/wrist pain - Patient had imaging in emergency department which showed lytic lesion in right acetabulum - MR spine did show evidence of canal stenosis  Plan: Will need oncology consult in the morning to further assess lytic lesion Patient was placed on antibiotics however suspicion for acute infection is low.  Will discontinue at this time.  # Hyperlipidemia-continue statin  # Hypertension-continue losartan    Admission status: Inpatient Med-Surg  Certification: The appropriate patient status for this patient is INPATIENT. Inpatient status is judged to be reasonable and necessary in order to provide the required intensity of service to ensure the patient's safety. The patient's presenting symptoms, physical exam findings, and initial radiographic and laboratory data in the context of their chronic comorbidities is felt to place them at high risk for further clinical deterioration. Furthermore, it is not anticipated that the patient will be medically stable for discharge from the hospital within 2 midnights of admission.   * I  certify that at the point of admission it is my clinical judgment that the patient will require inpatient hospital care spanning beyond 2 midnights from the point of admission due to high intensity of service, high risk for further deterioration and high frequency of surveillance required.DEWAINE Lamar Dess MD [facility] If 7PM-7AM, please contact night-coverage [email or web address]  03/21/2024, 1:48 AM

## 2024-03-21 NOTE — TOC Initial Note (Signed)
 Transition of Care [facility]) - Initial/Assessment Note    Patient Details  Name: Aaron Willis MRN: [id number] Date of Birth: 09/07/1960  [facility] CM/SW Contact:    Aaron JONELLE Rex, RN Phone Number: 03/21/2024, 10:13 AM  Clinical Narrative:     Patient presented to ED with c/o right hip pain/wrist pain , admitted, undergoing work up. Resides in an apartment, has a significant other on file for contact, PCP and insurance on file. INPT CM will follow for dc needs.                Barriers to Discharge: Continued Medical Work up   Patient Goals and CMS Choice Patient states their goals for this hospitalization and ongoing recovery are:: return home          Expected Discharge Plan and Services       Living arrangements for the past 2 months: Apartment                                      Prior Living Arrangements/Services Living arrangements for the past 2 months: Apartment Lives with:: Self Patient language and need for interpreter reviewed:: Yes        Need for Family Participation in Patient Care: Yes (Comment) Care giver support system in place?: Yes (comment)   Criminal Activity/Legal Involvement Pertinent to Current Situation/Hospitalization: No - Comment as needed  Activities of Daily Living   ADL Screening (condition at time of admission) Independently performs ADLs?: Yes (appropriate for developmental age) Is the patient deaf or have difficulty hearing?: No Does the patient have difficulty seeing, even when wearing glasses/contacts?: No Does the patient have difficulty concentrating, remembering, or making decisions?: No  Permission Sought/Granted                  Emotional Assessment       Orientation: : Oriented to Self, Oriented to Place, Oriented to  Time, Oriented to Situation Alcohol / Substance Use: Not Applicable Psych Involvement: No (comment)  Admission diagnosis:  Wrist pain [M25.539] Sepsis, due to  unspecified organism, unspecified whether acute organ dysfunction present [facility]) [A41.9] Patient Active Problem List   Diagnosis Date Noted   Wrist pain 03/21/2024   Backache 12/30/2021   Closed fracture of shaft of femur (HCC) 12/30/2021   Obesity 12/30/2021   Posttraumatic stress disorder 12/30/2021   BPH associated with nocturia 01/01/2020   Elevated PSA 01/01/2020   PCP:  [facility] Pharmacy:   [id number] [address], [place] - [address] AT [address] [place] [address] Phone: [phone] Fax: [phone]     Social Drivers of Health (SDOH) Social History: SDOH Screenings   Food Insecurity: No Food Insecurity (03/21/2024)  Housing: Low Risk  (03/21/2024)  Transportation Needs: No Transportation Needs (03/21/2024)  Utilities: Not At Risk (03/21/2024)  Tobacco Use: Unknown (03/21/2024)   SDOH Interventions:     Readmission Risk Interventions    03/21/2024   10:12 AM  Readmission Risk Prevention Plan  Post Dischage Appt Complete  Medication Screening Complete  Transportation Screening Complete

## 2024-03-21 NOTE — Plan of Care (Signed)

## 2024-03-22 ENCOUNTER — Inpatient Hospital Stay (HOSPITAL_COMMUNITY)

## 2024-03-22 DIAGNOSIS — M25531 Pain in right wrist: Secondary | ICD-10-CM

## 2024-03-22 DIAGNOSIS — R52 Pain, unspecified: Secondary | ICD-10-CM

## 2024-03-22 DIAGNOSIS — M898X5 Other specified disorders of bone, thigh: Secondary | ICD-10-CM

## 2024-03-22 LAB — PSA: Prostatic Specific Antigen: 15.04 ng/mL — ABNORMAL HIGH (ref 0.00–4.00)

## 2024-03-22 LAB — BASIC METABOLIC PANEL WITH GFR
Anion gap: 12 (ref 5–15)
BUN: 17 mg/dL (ref 8–23)
CO2: 24 mmol/L (ref 22–32)
Calcium: 9.2 mg/dL (ref 8.9–10.3)
Chloride: 99 mmol/L (ref 98–111)
Creatinine, Ser: 1.02 mg/dL (ref 0.61–1.24)
GFR, Estimated: >60 mL/min (ref >60)
Glucose, Bld: 133 mg/dL — ABNORMAL HIGH (ref 70–99)
Potassium: 4.3 mmol/L (ref 3.5–5.1)
Sodium: 135 mmol/L (ref 135–145)

## 2024-03-22 LAB — HEPATIC FUNCTION PANEL
ALT: 8 U/L (ref 0–44)
AST: 19 U/L (ref 15–41)
Albumin: 3.7 g/dL (ref 3.5–5.0)
Alkaline Phosphatase: 92 U/L (ref 38–126)
Bilirubin, Direct: 0.3 mg/dL — ABNORMAL HIGH (ref 0.0–0.2)
Indirect Bilirubin: 0.3 mg/dL (ref 0.3–0.9)
Total Bilirubin: 0.6 mg/dL (ref 0.0–1.2)
Total Protein: 7.8 g/dL (ref 6.5–8.1)

## 2024-03-22 LAB — CBC
HCT: 41.9 % (ref 39.0–52.0)
Hemoglobin: 14.3 g/dL (ref 13.0–17.0)
MCH: 32.9 pg (ref 26.0–34.0)
MCHC: 34.1 g/dL (ref 30.0–36.0)
MCV: 96.5 fL (ref 80.0–100.0)
Platelets: 197 K/uL (ref 150–400)
RBC: 4.34 MIL/uL (ref 4.22–5.81)
RDW: 14.6 % (ref 11.5–15.5)
WBC: 12.1 K/uL — ABNORMAL HIGH (ref 4.0–10.5)
nRBC: 0 % (ref 0.0–0.2)

## 2024-03-22 LAB — LACTATE DEHYDROGENASE: LDH: 228 U/L (ref 105–235)

## 2024-03-22 MED ORDER — OXYCODONE HCL 5 MG PO TABS
10.0000 mg | ORAL_TABLET | ORAL | Status: DC | PRN
  Administered 2024-03-22 – 2024-03-27 (×12): 10 mg via ORAL
  Filled 2024-03-22 (×15): qty 2

## 2024-03-22 MED ORDER — IOHEXOL 300 MG/ML  SOLN
75.0000 mL | Freq: Once | INTRAMUSCULAR | Status: AC | PRN
  Administered 2024-03-22: 75 mL via INTRAVENOUS

## 2024-03-22 MED ORDER — HYDROMORPHONE HCL 1 MG/ML IJ SOLN
0.5000 mg | INTRAMUSCULAR | Status: DC | PRN
  Administered 2024-03-22 – 2024-03-26 (×9): 0.5 mg via INTRAVENOUS
  Filled 2024-03-22 (×11): qty 0.5

## 2024-03-22 MED ORDER — SODIUM CHLORIDE (PF) 0.9 % IJ SOLN
INTRAMUSCULAR | Status: AC
  Filled 2024-03-22: qty 50

## 2024-03-22 NOTE — Plan of Care (Signed)

## 2024-03-22 NOTE — Consult Note (Signed)
 [facility]  [phone]) [phone]   HEMATOLOGY ONCOLOGY INPATIENT CONSULTATION   EIVAN GALLINA  DOB: 1961-02-25  MR#: [id number]  CSN#: [id number]    Requesting Physician: [facility]  Patient Care Team: [facility] as PCP - General (General Practice)  Reason for consult: right acetabulum lytic bone lesion   History of present illness:   Patient is a 63 year old gentleman with past medical history of hypertension and DM, elevated PSA, presented to to the emergency room with progressive right hip pain, and right wrist pain.  He was found to have a lytic bone lesion in the right acetabulum on MRI and a CT scan, I was called to see patient to rule out malignancy.  He has right hip pain started a few weeks ago, it has progressively getting worse, especially with position change or walking.  It is quite severe to the point that he is not able to walk since yesterday.  He denies any injury.  The pain radiates to his back so.  He also noticed right wrist pain, no fever, chills, hip or wrist edema.  No recent change of appetite or weight loss.  Review of system otherwise negative.  Patient reports adequate PSA for the past 2 years, initially was 20, he did spontaneously drop to around 10 on repeated test.  He was seen by urologist at the [facility], and underwent a biopsy which was negative for malignancy, per patient.  He does not have nocturia or urinary frequency, no hematuria.  MEDICAL HISTORY:  Past Medical History:  Diagnosis Date   Diabetes mellitus without complication (HCC)    Hypertension     SURGICAL HISTORY: Past Surgical History:  Procedure Laterality Date   FEMUR FRACTURE SURGERY Right    KNEE SURGERY Left     SOCIAL HISTORY: Social History   Socioeconomic History   Marital status: Married    Spouse name: Not on file   Number of children: Not on file   Years of education: Not on file   Highest education level: Not on file  Occupational  History   Not on file  Tobacco Use   Smoking status: Never   Smokeless tobacco: Not on file  Substance and Sexual Activity   Alcohol use: Yes    Alcohol/week: 1.0 standard drink of alcohol    Types: 1 Cans of beer per week    Comment: Seldom   Drug use: No    Frequency: 3.0 times per week   Sexual activity: Yes  Other Topics Concern   Not on file  Social History Narrative   Not on file   Social Drivers of Health   Financial Resource Strain: Not on file  Food Insecurity: No Food Insecurity (03/21/2024)   Hunger Vital Sign    Worried About Running Out of Food in the Last Year: Never true    Ran Out of Food in the Last Year: Never true  Transportation Needs: No Transportation Needs (03/21/2024)   PRAPARE - Administrator, Civil Service (Medical): No    Lack of Transportation (Non-Medical): No  Physical Activity: Not on file  Stress: Not on file  Social Connections: Not on file  Intimate Partner Violence: Not At Risk (03/21/2024)   Humiliation, Afraid, Rape, and Kick questionnaire    Fear of Current or Ex-Partner: No    Emotionally Abused: No    Physically Abused: No    Sexually Abused: No    FAMILY HISTORY: Family History  Problem  Relation Age of Onset   Breast cancer Mother     ALLERGIES:  is allergic to ibuprofen.  MEDICATIONS:  Current Facility-Administered Medications  Medication Dose Route Frequency Provider Last Rate Last Admin   acetaminophen  (TYLENOL ) tablet 1,000 mg  1,000 mg Oral Q4H PRN Dena Charleston, MD   1,000 mg at 03/22/24 0413   atorvastatin  (LIPITOR) tablet 40 mg  40 mg Oral Daily Dorrell, Robert, MD   40 mg at 03/22/24 0940   enoxaparin  (LOVENOX ) injection 40 mg  40 mg Subcutaneous Q24H Dena Charleston, MD   40 mg at 03/22/24 0940   HYDROmorphone  (DILAUDID ) injection 0.5 mg  0.5 mg Intravenous Q4H PRN Arlon Carliss ORN, DO   0.5 mg at 03/22/24 2002   losartan  (COZAAR ) tablet 50 mg  50 mg Oral Daily Dena Charleston, MD   50 mg at  03/22/24 0940   oxyCODONE  (Oxy IR/ROXICODONE ) immediate release tablet 10 mg  10 mg Oral Q4H PRN Arlon Carliss ORN, DO   10 mg at 03/22/24 2128    REVIEW OF SYSTEMS:   Constitutional: Denies fevers, chills or abnormal night sweats Eyes: Denies blurriness of vision, double vision or watery eyes Ears, nose, mouth, throat, and face: Denies mucositis or sore throat Respiratory: Denies cough, dyspnea or wheezes Cardiovascular: Denies palpitation, chest discomfort or lower extremity swelling Gastrointestinal:  Denies nausea, heartburn or change in bowel habits Skin: Denies abnormal skin rashes Lymphatics: Denies new lymphadenopathy or easy bruising Neurological:Denies numbness, tingling or new weaknesses Behavioral/Psych: Mood is stable, no new changes  All other systems were reviewed with the patient and are negative.  PHYSICAL EXAMINATION: ECOG PERFORMANCE STATUS: 2 - Symptomatic, <50% confined to bed  Vitals:   03/22/24 1328 03/22/24 2056  BP: (!) 177/80 (!) 164/79  Pulse: 99 (!) 106  Resp: 20 18  Temp: 99.3 F (37.4 C) 98.1 F (36.7 C)  SpO2: 98% 93%   Filed Weights   03/21/24 0330  Weight: 224 lb 10.4 oz (101.9 kg)    GENERAL:alert, no distress and comfortable SKIN: skin color, texture, turgor are normal, no rashes or significant lesions EYES: normal, conjunctiva are pink and non-injected, sclera clear OROPHARYNX:no exudate, no erythema and lips, buccal mucosa, and tongue normal  NECK: supple, thyroid normal size, non-tender, without nodularity LYMPH:  no palpable lymphadenopathy in the cervical, axillary or inguinal LUNGS: clear to auscultation and percussion with normal breathing effort HEART: regular rate & rhythm and no murmurs and no lower extremity edema ABDOMEN:abdomen soft, non-tender and normal bowel sounds Musculoskeletal:no cyanosis of digits and no clubbing  PSYCH: alert & oriented x 3 with fluent speech NEURO: no focal motor/sensory deficits  LABORATORY  DATA:  I have reviewed the data as listed Lab Results  Component Value Date   WBC 12.1 (H) 03/22/2024   HGB 14.3 03/22/2024   HCT 41.9 03/22/2024   MCV 96.5 03/22/2024   PLT 197 03/22/2024   Recent Labs    03/20/24 1235 03/22/24 0448 03/22/24 1442  NA 137 135  --   K 4.0 4.3  --   CL 98 99  --   CO2 26 24  --   GLUCOSE 108* 133*  --   BUN 17 17  --   CREATININE 1.31* 1.02  --   CALCIUM  9.8 9.2  --   GFRNONAA >60 >60  --   PROT  --   --  7.8  ALBUMIN  --   --  3.7  AST  --   --  19  ALT  --   --  8  ALKPHOS  --   --  92  BILITOT  --   --  0.6  BILIDIR  --   --  0.3*  IBILI  --   --  0.3    RADIOGRAPHIC STUDIES: I have personally reviewed the radiological images as listed and agreed with the findings in the report. CT CHEST ABDOMEN PELVIS W CONTRAST Result Date: 03/21/2024 EXAM: CT CHEST, ABDOMEN AND PELVIS WITH CONTRAST 03/21/2024 03:11:07 AM TECHNIQUE: CT of the chest, abdomen and pelvis was performed with the administration of 100 mL of iohexol  (OMNIPAQUE ) 300 MG/ML solution intravenous contrast. Multiplanar reformatted images are provided for review. Automated exposure control, iterative reconstruction, and/or weight based adjustment of the mA/kV was utilized to reduce the radiation dose to as low as reasonably achievable. COMPARISON: Plain film and MRI from earlier in the same day. CLINICAL HISTORY: Lytic lesion within the pelvis, evaluate for additional lytic lesions . FINDINGS: CHEST: MEDIASTINUM AND LYMPH NODES: Heart is within normal limits. No coronary calcifications are noted. Pericardium is unremarkable. The central airways are clear. No mediastinal, hilar or axillary lymphadenopathy. The thoracic aorta is within normal limits. The pulmonary artery is visualized and is within normal limits. The esophagus as visualized is within normal limits. Thoracic inlet is within normal limits. LUNGS AND PLEURA: Lungs are well aerated bilaterally. Patchy atelectatic changes are  noted in bases bilaterally. No sizable parenchymal nodule is noted. No pleural effusion or pneumothorax. ABDOMEN AND PELVIS: LIVER: Liver is within normal limits. GALLBLADDER AND BILE DUCTS: Gallbladder is within normal limits. No biliary ductal dilatation. SPLEEN: Spleen is unremarkable. PANCREAS: Pancreas is unremarkable. ADRENAL GLANDS: The adrenal glands are well visualized and within normal limits. KIDNEYS, URETERS AND BLADDER: Kidneys are well visualized and within normal limits with the exception of a tiny cyst within the left kidney. No follow-up is recommended. No stones in the kidneys or ureters. The bladder is partially distended. GI AND BOWEL: Stomach and small bowel are within normal limits. No obstructive or inflammatory changes of the colon are noted. The appendix is within normal limits. There is no bowel obstruction. REPRODUCTIVE ORGANS: No acute abnormality. A small cyst is noted centrally within the seminal vesicle. PERITONEUM AND RETROPERITONEUM: No ascites. No free air. VASCULATURE: Aorta is normal in caliber. ABDOMINAL AND PELVIS LYMPH NODES: No lymphadenopathy. BONES AND SOFT TISSUES: Bony structures show a lytic lesion within the right acetabulum superiorly similar to that seen on recent MRI. Lytic changes are noted along the inferior aspect of the L5 vertebral body again, likely representing a schmorl's node. No other lytic lesions are seen. No focal soft tissue abnormality. IMPRESSION: 1. Lytic lesion within the right acetabulum superiorly, similar to recent MRI, with no additional lytic lesions identified. Electronically signed by: Oneil Devonshire MD 03/21/2024 03:28 AM EST RP Workstation: [id number]   MR PELVIS W WO CONTRAST Result Date: 03/20/2024 EXAM: MRI OF THE PELVIS WITH AND WITHOUT INTRAVENOUS CONTRAST 03/20/2024 05:09:16 PM TECHNIQUE: Multiplanar magnetic resonance images of the pelvis with and without intravenous contrast. COMPARISON: None available. CLINICAL HISTORY: Aggressive  right acetabular lesion on radiography, right hip pain. FINDINGS: SOFT TISSUES: Low level adjacent edema tracking along the deep margin of the iliopsoas. Low level edema posteriorly in the left greater trochanter likely due to adjacent gluteus medius tendinopathy. JOINTS: No dislocation or significant effusion. LIMITED INTRAPELVIC CONTENTS: Utricle cyst or mullerian remnant along the posterior superior prostate gland. BONES: MRI confirms an aggressive lytic expansile 3.7 x 2.7  x 3.2 cm mass in the right upper acetabulum with surrounding marrow edema and enhancement. There is cortical demineralization/thinning associated with this lesion. Enhancement along the inferior endplate of L5, please see the dedicated lumbar spine MRI report for further characterization. 0.8 cm mildly enhancing lesion in the ischial tuberosity on image 43 series 8, probably a hemangioma given the T1 signal characteristics, although technically nonspecific due to small size. Metal artifact from intramedullary nail in the right femur. IMPRESSION: 1. Aggressive lytic expansile 3.7 x 2.7 x 3.2 cm mass in the right upper acetabulum with surrounding marrow edema, enhancement, and cortical demineralization/thinning. Lytic metastatic disease or myeloma are the top differential diagnostic considerations, correlate with serologic indicators in assessing for myeloma, and consider further technical imaging in the context of oncology workup. 2. 0.8 cm mildly enhancing lesion in the right ischial tuberosity, probably a hemangioma but technically nonspecific due to small size. Recommend attention on follow-up imaging. 3. Low level edema posteriorly in the left greater trochanter, likely due to adjacent gluteus medius tendinopathy. Electronically signed by: Ryan Salvage MD 03/20/2024 05:37 PM EST RP Workstation: [id number]   MR LUMBAR SPINE WO CONTRAST Result Date: 03/20/2024 EXAM: MRI LUMBAR SPINE 03/20/2024 05:09:16 PM TECHNIQUE: Multiplanar  multisequence MRI of the lumbar spine was performed without the administration of intravenous contrast. COMPARISON: None available. CLINICAL HISTORY: Lumbar radiculopathy with right thigh pain. FINDINGS: BONES AND ALIGNMENT: Normal alignment. Normal vertebral body heights. Congenitally short pedicles in the lumbar spine. Focal inferior endplate irregularity with surrounding marrow edema along the inferior endplate of L5, most resembling an acute Schmorl node; however, given findings in the right acetabulum, the possibility of a metastatic lesion along the inferior endplate of L5 is not totally ruled out. Adjacent type 1 and type 2 degenerative endplate findings noted. Background marrow signal is unremarkable unless otherwise stated. SPINAL CORD: Normal appearing conus medullaris terminates at L1-L2. SOFT TISSUES: No paraspinal mass. LIMITATIONS/ARTIFACTS: Motion artifact is present, reducing diagnostic sensitivity and specificity. DISC DESICCATION: Disc desiccation at L3-L4, L4-L5, and L1-L2. T12-L1: Unremarkable. L1-L2: Disc bulge. No impingement. L2-L3: Disc bulge and mild facet arthropathy. No impingement. L3-L4: Moderate central stenosis due to disc bulge and short pedicles. L4-L5: Moderate central stenosis with moderate right subarticular lateral recess stenosis and moderate right foraminal stenosis due to disc bulge, right lateral recess and foraminal disc protrusion, facet arthropathy, and short pedicles. L5-S1: Borderline bilateral foraminal stenosis due to short pedicles, disc osteophyte complex, and facet arthropathy. IMPRESSION: 1. Moderate central stenosis with moderate right subarticular lateral recess stenosis and moderate right foraminal stenosis at L4-5 due to disc bulge, right lateral recess and foraminal disc protrusion, facet arthropathy, and short pedicles. 2. Moderate central stenosis at L3-4 due to disc bulge and short pedicles. 3. Focal inferior endplate irregularity with surrounding marrow  edema along the inferior endplate of L5, most resembling an acute Schmorl's node; metastatic lesion cannot be completely excluded. Electronically signed by: Ryan Salvage MD 03/20/2024 05:28 PM EST RP Workstation: [id number]   DG Femur Min 2 Views Right Result Date: 03/20/2024 EXAM: 2 VIEW(S) XRAY OF THE FEMUR 03/20/2024 01:05:00 PM COMPARISON: 03/29/2010 CLINICAL HISTORY: hip pain/ h/o femur fracture with hardware FINDINGS: BONES AND JOINTS: Intramedullary rod and screws in place traversing healed fracture deformity of mid femoral diaphysis, with single proximal and 2 distal interlocking screws. Demineralization of the upper medial acetabulum, underlying lytic lesion not excluded. This is further discussed in the hip/pelvis radiograph report. No joint dislocation. SOFT TISSUES: The soft tissues are unremarkable. IMPRESSION: 1.  Demineralization of the upper medial right acetabulum with possible underlying lytic lesion; refer to today's hip/pelvis radiograph report for further discussion. 2. Intramedullary nail traversing healed mid femoral diaphyseal fracture, unchanged hardware alignment. Electronically signed by: Ryan Salvage MD 03/20/2024 02:46 PM EST RP Workstation: [id number]   DG Hip Unilat With Pelvis 2-3 Views Right Result Date: 03/20/2024 EXAM: 2 or more VIEW(S) XRAY OF THE HIP 03/20/2024 01:05:00 PM COMPARISON: 03/29/2010 CLINICAL HISTORY: hip pain/ h/o femur fracture with hardware FINDINGS: BONES AND JOINTS: Retrograde intramedullary nail in the right femur. Abnormal 2.1 cm lucency in the right upper medial acetabulum with demineralization of the cortical margin along the acetabulum concerning for a possible lytic lesion. Lytic osseous metastatic disease or myeloma not excluded. Dedicated MRI of the bony pelvis is recommended for more complete assessment. Mild degenerative chondral thinning in both hips. The hip joint is maintained. SOFT TISSUES: The soft tissues are unremarkable.  IMPRESSION: 1. Abnormal 2.1 cm lucency in the right upper medial acetabulum with cortical demineralization, concerning for a lytic lesion; lytic osseous metastatic disease or myeloma not excluded, recommend dedicated MRI of the bony pelvis for further assessment. 2. Mild degenerative chondral thinning in both hips. Electronically signed by: Ryan Salvage MD 03/20/2024 02:43 PM EST RP Workstation: [id number]   DG Wrist Complete Right Result Date: 03/20/2024 EXAM: 3 OR MORE VIEW(S) XRAY OF THE WRIST 03/20/2024 12:16:00 PM COMPARISON: None available. CLINICAL HISTORY: pain swelling FINDINGS: BONES AND JOINTS: No acute fracture. No focal osseous lesion. No joint dislocation. SOFT TISSUES: Mild soft tissue swelling. IMPRESSION: 1. Mild soft tissue swelling. 2. No acute fracture or dislocation. Electronically signed by: Donnice Mania MD 03/20/2024 12:49 PM EST RP Workstation: [id number]    ASSESSMENT & PLAN:  63 yo male   right acetabulum lytic bone lesion, rule out malignancy  Right hip pain, probably secondary to #1 Elevated PSA, negative prostate biopsy at [facility] per pt  DM HTN  Recommendations: -I personally reviewed his CT scan images, which is negative for other signs of malignancy or metastasis, except litigated lesion in right acetabulum  - I have ordered multiple myeloma panel and serum light chain level, LDH, PSA, culture pending. - Will consult orthopedic surgery, to see if he needs any prophylactic surgical intervention for the large lytic lesion in right acetabulum, and if biopsy is feasible surgery -if his PSA is significantly elevated, will order prostate MRI, and PSMA (after discharge). [facility] but would like to f/u with us  for work up and treatment if he has malignancy. -I will f/u   All questions were answered. The patient knows to call the clinic with any problems, questions or concerns.      Onita Mattock, MD 03/22/2024

## 2024-03-22 NOTE — Progress Notes (Signed)
 Progress Note   Patient: Aaron Willis FMW:[id number] DOB: 1961-04-13 DOA: 03/20/2024  DOS: the patient was seen and examined on 03/22/2024   Brief hospital course:  63 y.o. male with medical history significant of hypertension, use who presented to the emergency department with right hip pain and arm pain.   Assessment and Plan:  Lytic lesions in right acetabulum/right hip pain - MRI pelvis noting aggressive lytic expansile 3.7 x 2.7 x 3.2 cm mass in the right upper acetabulum with surrounding marrow edema, enhancement, and cortical demineralization/thinning.  No obvious concern for lytic multiple myeloma or metastatic disease.  CT chest abdomen pelvis unrevealing.  Reached out to oncology for consult, have not seen the patient yet.  Attempting to to increase opioid therapy on board, transition from morphine  to Dilaudid , increase oxycodone  to 10 mg every 6 hours as needed.  Patient has allergy to NSAIDs.  Right wrist pain - X-ray imaging unremarkable.  However does appear to be mildly edematous and slightly erythematous.  Patient denies any injury or fall.  Will order right upper extremity CT.  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  Does not appear to be acute.  However may be contributing to underlying pain or weakness.  PT consult ordered.  Physical debilitation and muscle weakness - Exacerbated by pain from above.  Opioid therapy on board.  PT ordered.   Subjective: Patient sitting up at bedside.  Still complaining of pain that continues to be uncontrolled.  Denies any shortness of breath, fever, chills, chest pain, nausea, vomiting, abdominal pain.  States his right wrist pain appears to be getting worse, now has some swelling.  No injury.  Physical Exam:  Vitals:   03/21/24 1731 03/21/24 2110 03/22/24 0115 03/22/24 0505  BP: (!) 162/95 (!) 149/73 (!) 172/86 (!) 172/84  Pulse: (!) 101 87 97 62  Resp: 18 17 17 17   Temp: 100.3 F  (37.9 C) 98.6 F (37 C) 99.5 F (37.5 C) 99.4 F (37.4 C)  TempSrc: Oral Oral    SpO2: 96% 96% 98% 91%  Weight:      Height:        GENERAL:  Alert, pleasant, no acute distress  HEENT:  EOMI CARDIOVASCULAR:  RRR, no murmurs appreciated RESPIRATORY:  Clear to auscultation, no wheezing, rales, or rhonchi GASTROINTESTINAL:  Soft, nontender, nondistended EXTREMITIES: Right wrist mildly edematous and erythematous, ROM limited by pain NEURO:  No new focal deficits appreciated SKIN:  No rashes noted PSYCH:  Appropriate mood and affect     Data Reviewed:  Imaging Studies: CT CHEST ABDOMEN PELVIS W CONTRAST Result Date: 03/21/2024 EXAM: CT CHEST, ABDOMEN AND PELVIS WITH CONTRAST 03/21/2024 03:11:07 AM TECHNIQUE: CT of the chest, abdomen and pelvis was performed with the administration of 100 mL of iohexol  (OMNIPAQUE ) 300 MG/ML solution intravenous contrast. Multiplanar reformatted images are provided for review. Automated exposure control, iterative reconstruction, and/or weight based adjustment of the mA/kV was utilized to reduce the radiation dose to as low as reasonably achievable. COMPARISON: Plain film and MRI from earlier in the same day. CLINICAL HISTORY: Lytic lesion within the pelvis, evaluate for additional lytic lesions . FINDINGS: CHEST: MEDIASTINUM AND LYMPH NODES: Heart is within normal limits. No coronary calcifications are noted. Pericardium is unremarkable. The central airways are clear. No mediastinal, hilar or axillary lymphadenopathy. The thoracic aorta is within normal limits. The pulmonary artery is visualized and is within normal limits. The esophagus as visualized is within normal  limits. Thoracic inlet is within normal limits. LUNGS AND PLEURA: Lungs are well aerated bilaterally. Patchy atelectatic changes are noted in bases bilaterally. No sizable parenchymal nodule is noted. No pleural effusion or pneumothorax. ABDOMEN AND PELVIS: LIVER: Liver is within normal limits.  GALLBLADDER AND BILE DUCTS: Gallbladder is within normal limits. No biliary ductal dilatation. SPLEEN: Spleen is unremarkable. PANCREAS: Pancreas is unremarkable. ADRENAL GLANDS: The adrenal glands are well visualized and within normal limits. KIDNEYS, URETERS AND BLADDER: Kidneys are well visualized and within normal limits with the exception of a tiny cyst within the left kidney. No follow-up is recommended. No stones in the kidneys or ureters. The bladder is partially distended. GI AND BOWEL: Stomach and small bowel are within normal limits. No obstructive or inflammatory changes of the colon are noted. The appendix is within normal limits. There is no bowel obstruction. REPRODUCTIVE ORGANS: No acute abnormality. A small cyst is noted centrally within the seminal vesicle. PERITONEUM AND RETROPERITONEUM: No ascites. No free air. VASCULATURE: Aorta is normal in caliber. ABDOMINAL AND PELVIS LYMPH NODES: No lymphadenopathy. BONES AND SOFT TISSUES: Bony structures show a lytic lesion within the right acetabulum superiorly similar to that seen on recent MRI. Lytic changes are noted along the inferior aspect of the L5 vertebral body again, likely representing a schmorl's node. No other lytic lesions are seen. No focal soft tissue abnormality. IMPRESSION: 1. Lytic lesion within the right acetabulum superiorly, similar to recent MRI, with no additional lytic lesions identified. Electronically signed by: Oneil Devonshire MD 03/21/2024 03:28 AM EST RP Workstation: [id number]   MR PELVIS W WO CONTRAST Result Date: 03/20/2024 EXAM: MRI OF THE PELVIS WITH AND WITHOUT INTRAVENOUS CONTRAST 03/20/2024 05:09:16 PM TECHNIQUE: Multiplanar magnetic resonance images of the pelvis with and without intravenous contrast. COMPARISON: None available. CLINICAL HISTORY: Aggressive right acetabular lesion on radiography, right hip pain. FINDINGS: SOFT TISSUES: Low level adjacent edema tracking along the deep margin of the iliopsoas. Low level  edema posteriorly in the left greater trochanter likely due to adjacent gluteus medius tendinopathy. JOINTS: No dislocation or significant effusion. LIMITED INTRAPELVIC CONTENTS: Utricle cyst or mullerian remnant along the posterior superior prostate gland. BONES: MRI confirms an aggressive lytic expansile 3.7 x 2.7 x 3.2 cm mass in the right upper acetabulum with surrounding marrow edema and enhancement. There is cortical demineralization/thinning associated with this lesion. Enhancement along the inferior endplate of L5, please see the dedicated lumbar spine MRI report for further characterization. 0.8 cm mildly enhancing lesion in the ischial tuberosity on image 43 series 8, probably a hemangioma given the T1 signal characteristics, although technically nonspecific due to small size. Metal artifact from intramedullary nail in the right femur. IMPRESSION: 1. Aggressive lytic expansile 3.7 x 2.7 x 3.2 cm mass in the right upper acetabulum with surrounding marrow edema, enhancement, and cortical demineralization/thinning. Lytic metastatic disease or myeloma are the top differential diagnostic considerations, correlate with serologic indicators in assessing for myeloma, and consider further technical imaging in the context of oncology workup. 2. 0.8 cm mildly enhancing lesion in the right ischial tuberosity, probably a hemangioma but technically nonspecific due to small size. Recommend attention on follow-up imaging. 3. Low level edema posteriorly in the left greater trochanter, likely due to adjacent gluteus medius tendinopathy. Electronically signed by: Ryan Salvage MD 03/20/2024 05:37 PM EST RP Workstation: [id number]   MR LUMBAR SPINE WO CONTRAST Result Date: 03/20/2024 EXAM: MRI LUMBAR SPINE 03/20/2024 05:09:16 PM TECHNIQUE: Multiplanar multisequence MRI of the lumbar spine was performed without the administration  of intravenous contrast. COMPARISON: None available. CLINICAL HISTORY: Lumbar  radiculopathy with right thigh pain. FINDINGS: BONES AND ALIGNMENT: Normal alignment. Normal vertebral body heights. Congenitally short pedicles in the lumbar spine. Focal inferior endplate irregularity with surrounding marrow edema along the inferior endplate of L5, most resembling an acute Schmorl node; however, given findings in the right acetabulum, the possibility of a metastatic lesion along the inferior endplate of L5 is not totally ruled out. Adjacent type 1 and type 2 degenerative endplate findings noted. Background marrow signal is unremarkable unless otherwise stated. SPINAL CORD: Normal appearing conus medullaris terminates at L1-L2. SOFT TISSUES: No paraspinal mass. LIMITATIONS/ARTIFACTS: Motion artifact is present, reducing diagnostic sensitivity and specificity. DISC DESICCATION: Disc desiccation at L3-L4, L4-L5, and L1-L2. T12-L1: Unremarkable. L1-L2: Disc bulge. No impingement. L2-L3: Disc bulge and mild facet arthropathy. No impingement. L3-L4: Moderate central stenosis due to disc bulge and short pedicles. L4-L5: Moderate central stenosis with moderate right subarticular lateral recess stenosis and moderate right foraminal stenosis due to disc bulge, right lateral recess and foraminal disc protrusion, facet arthropathy, and short pedicles. L5-S1: Borderline bilateral foraminal stenosis due to short pedicles, disc osteophyte complex, and facet arthropathy. IMPRESSION: 1. Moderate central stenosis with moderate right subarticular lateral recess stenosis and moderate right foraminal stenosis at L4-5 due to disc bulge, right lateral recess and foraminal disc protrusion, facet arthropathy, and short pedicles. 2. Moderate central stenosis at L3-4 due to disc bulge and short pedicles. 3. Focal inferior endplate irregularity with surrounding marrow edema along the inferior endplate of L5, most resembling an acute Schmorl's node; metastatic lesion cannot be completely excluded. Electronically signed by:  Ryan Salvage MD 03/20/2024 05:28 PM EST RP Workstation: [id number]   DG Femur Min 2 Views Right Result Date: 03/20/2024 EXAM: 2 VIEW(S) XRAY OF THE FEMUR 03/20/2024 01:05:00 PM COMPARISON: 03/29/2010 CLINICAL HISTORY: hip pain/ h/o femur fracture with hardware FINDINGS: BONES AND JOINTS: Intramedullary rod and screws in place traversing healed fracture deformity of mid femoral diaphysis, with single proximal and 2 distal interlocking screws. Demineralization of the upper medial acetabulum, underlying lytic lesion not excluded. This is further discussed in the hip/pelvis radiograph report. No joint dislocation. SOFT TISSUES: The soft tissues are unremarkable. IMPRESSION: 1. Demineralization of the upper medial right acetabulum with possible underlying lytic lesion; refer to today's hip/pelvis radiograph report for further discussion. 2. Intramedullary nail traversing healed mid femoral diaphyseal fracture, unchanged hardware alignment. Electronically signed by: Ryan Salvage MD 03/20/2024 02:46 PM EST RP Workstation: [id number]   DG Hip Unilat With Pelvis 2-3 Views Right Result Date: 03/20/2024 EXAM: 2 or more VIEW(S) XRAY OF THE HIP 03/20/2024 01:05:00 PM COMPARISON: 03/29/2010 CLINICAL HISTORY: hip pain/ h/o femur fracture with hardware FINDINGS: BONES AND JOINTS: Retrograde intramedullary nail in the right femur. Abnormal 2.1 cm lucency in the right upper medial acetabulum with demineralization of the cortical margin along the acetabulum concerning for a possible lytic lesion. Lytic osseous metastatic disease or myeloma not excluded. Dedicated MRI of the bony pelvis is recommended for more complete assessment. Mild degenerative chondral thinning in both hips. The hip joint is maintained. SOFT TISSUES: The soft tissues are unremarkable. IMPRESSION: 1. Abnormal 2.1 cm lucency in the right upper medial acetabulum with cortical demineralization, concerning for a lytic lesion; lytic osseous metastatic  disease or myeloma not excluded, recommend dedicated MRI of the bony pelvis for further assessment. 2. Mild degenerative chondral thinning in both hips. Electronically signed by: Ryan Salvage MD 03/20/2024 02:43 PM EST RP Workstation: [id number]   DG  Wrist Complete Right Result Date: 03/20/2024 EXAM: 3 OR MORE VIEW(S) XRAY OF THE WRIST 03/20/2024 12:16:00 PM COMPARISON: None available. CLINICAL HISTORY: pain swelling FINDINGS: BONES AND JOINTS: No acute fracture. No focal osseous lesion. No joint dislocation. SOFT TISSUES: Mild soft tissue swelling. IMPRESSION: 1. Mild soft tissue swelling. 2. No acute fracture or dislocation. Electronically signed by: Donnice Mania MD 03/20/2024 12:49 PM EST RP Workstation: [id number]    Results are pending, will review when available.  Previous records (including but not limited to H&P, progress notes, nursing notes, TOC management) were reviewed in assessment of this patient.  Labs: CBC: Recent Labs  Lab 03/20/24 1235 03/20/24 1900 03/22/24 0448  WBC 9.8 12.3* 12.1*  NEUTROABS 7.8* 9.9*  --   HGB 15.6 15.0 14.3  HCT 45.3 43.3 41.9  MCV 97.2 96.2 96.5  PLT 201 196 197   Basic Metabolic Panel: Recent Labs  Lab 03/20/24 1235 03/22/24 0448  NA 137 135  K 4.0 4.3  CL 98 99  CO2 26 24  GLUCOSE 108* 133*  BUN 17 17  CREATININE 1.31* 1.02  CALCIUM  9.8 9.2   Liver Function Tests: No results for input(s): AST, ALT, ALKPHOS, BILITOT, PROT, ALBUMIN in the last 168 hours. CBG: No results for input(s): GLUCAP in the last 168 hours.  Scheduled Meds:  atorvastatin   40 mg Oral Daily   enoxaparin  (LOVENOX ) injection  40 mg Subcutaneous Q24H   losartan   50 mg Oral Daily   Continuous Infusions: PRN Meds:.acetaminophen , HYDROmorphone  (DILAUDID ) injection, oxyCODONE   Family Communication: None at bedside  Disposition: Status is: Inpatient Remains inpatient appropriate because: Uncontrolled pain, lytic lesion     Time spent:  50 minutes  Length of inpatient stay: 1 days  Author: Carliss LELON Canales, DO 03/22/2024 12:11 PM  For on call review [email or web address].

## 2024-03-23 LAB — PROTIME-INR
INR: 1 (ref 0.8–1.2)
Prothrombin Time: 13.4 s (ref 11.4–15.2)

## 2024-03-23 LAB — URIC ACID: Uric Acid, Serum: 4.7 mg/dL (ref 3.7–8.6)

## 2024-03-23 MED ORDER — CEFAZOLIN SODIUM-DEXTROSE 2-4 GM/100ML-% IV SOLN
2.0000 g | Freq: Three times a day (TID) | INTRAVENOUS | Status: DC
  Administered 2024-03-23 – 2024-03-29 (×17): 2 g via INTRAVENOUS
  Filled 2024-03-23 (×18): qty 100

## 2024-03-23 MED ORDER — DICLOFENAC SODIUM 1 % EX GEL
2.0000 g | Freq: Four times a day (QID) | CUTANEOUS | Status: DC
  Administered 2024-03-23 – 2024-03-29 (×12): 2 g via TOPICAL
  Filled 2024-03-23: qty 100

## 2024-03-23 NOTE — Evaluation (Signed)
 Physical Therapy Evaluation Patient Details Name: Aaron Willis MRN: [id number] DOB: [birth year] Today's Date: 03/23/2024  History of Present Illness  63 yo male presents to therapy following hospital on 03/20/2024 due to R wrist and hip pain, pt wound to have lytic bone lesion in the R acetabulum. Pt PMH includes but is not limited to: DM II, HTN, R femur fx and L knee surgery.  Clinical Impression    Pt admitted with above diagnosis.  Pt currently with functional limitations due to the deficits listed below (see PT Problem List). Pt seated on EOB when PT arrived. Andree present. Pt agreeable to therapy intervention. Pt reporting 8/10 pain. Pt noted to have R distal UE edema, R proximal LE edema. Pt indicated sudden onset of pain and edema on Tuesday and prior to that time pt IND and working  Pt reports he is scheduled for biopsy on Monday. Pt required min A for sit to stand  from EOB, noted instability and pt unable to obtain upright extension posture due to pain, PT providing support for R UE, pt exhibited difficulty reaching for IV pole with L UE, pt able to take 2 steps forward and then backward to bed with PT deeming gait unsafe at this time with pain and instability. Pt required min A for sit to supine, PT positioned R UE on pillows, CP to R hip and LE, all needs in place and visitor present. PT communicated concern per R UE and LE  edema and pain to nurse. Pending pt progression medically and with therapy while in the hospital pt will benefit from continued inpatient follow up therapy, <3 hours/day vs HH services. Pt will benefit from acute skilled PT to increase their independence and safety with mobility to allow discharge.         If plan is discharge home, recommend the following: A lot of help with walking and/or transfers;A lot of help with bathing/dressing/bathroom;Assistance with cooking/housework;Assist for transportation;Help with stairs or ramp for entrance   Can travel by private  vehicle   Yes    Equipment Recommendations Other (comment) (TBD)  Recommendations for Other Services  OT consult    Functional Status Assessment Patient has had a recent decline in their functional status and demonstrates the ability to make significant improvements in function in a reasonable and predictable amount of time.     Precautions / Restrictions Precautions Precautions: Fall Restrictions Weight Bearing Restrictions Per Provider Order: No      Mobility  Bed Mobility Overal bed mobility: Needs Assistance Bed Mobility: Sit to Supine       Sit to supine: Min assist   General bed mobility comments: bed flat, min A for R LE and cues for repositioning in bed    Transfers Overall transfer level: Needs assistance Equipment used: Rolling walker (2 wheels), None (IV pole) Transfers: Sit to/from Stand Sit to Stand: Min assist, From elevated surface           General transfer comment: pt unable to reach and grasp RW with R UE, trial with HHA and pt required L UE support however pt appears to have L shoulder ROM dysfuction and unable to reach for IV pole without hand over hand A, able to maintain grasp.    Ambulation/Gait Ambulation/Gait assistance: Min Chemical Engineer (Feet): 2 Feet Assistive device: IV Pole, 1 person hand held assist Gait Pattern/deviations: Step-to pattern, Knee flexed in stance - right, Knee flexed in stance - left, Decreased stance time - right, Decreased  weight shift to right, Trunk flexed, Wide base of support, Antalgic Gait velocity: decreased     General Gait Details: gait trial with L UE support at IV pole and PT providing support at R UE due to pain and edema, pt unable to tolerate WB nor grasp RW at time of eval, pt unable to achive full upright standing posutre with knee, hip and back flexion pt able to take a 3 suffling steps with wide BOS forward and backward with noted instabiltiy and pt expressing pain no WB restrictions in chart  at time of eval.  Stairs            Wheelchair Mobility     Tilt Bed    Modified Rankin (Stroke Patients Only)       Balance Overall balance assessment: Needs assistance Sitting-balance support: Feet supported Sitting balance-Leahy Scale: Good     Standing balance support: Single extremity supported, During functional activity Standing balance-Leahy Scale: Poor                               Pertinent Vitals/Pain Pain Assessment Pain Assessment: 0-10 Pain Score: 8  Pain Location: R hip and UE Pain Descriptors / Indicators: Aching, Discomfort, Constant, Guarding, Grimacing, Sore, Tender, Tightness Pain Intervention(s): Limited activity within patient's tolerance, Monitored during session, Premedicated before session, Repositioned, Ice applied    Home Living Family/patient expects to be discharged to:: Private residence Living Arrangements: Alone Available Help at Discharge: Family Type of Home: Apartment Home Access: Level entry     Alternate Level Stairs-Number of Steps: flight Home Layout: Two level;Bed/bath upstairs Home Equipment: None      Prior Function Prior Level of Function : Independent/Modified Independent;Working/employed;Driving             Mobility Comments: IND no AD for all ADLs, self care tasks and IADLs 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     Extremity/Trunk Assessment   Upper Extremity Assessment Upper Extremity Assessment:  (R UE edema-elbow distally with warm to touch with the exeption of distal rays, decreased finger ROM, absent grasp on R, active shoulder shrugs, elbow flexion and extension unable to tolerate R wrist PROM or AROM  and L shoulder ROM dysfunction)    Lower Extremity Assessment Lower Extremity Assessment: RLE deficits/detail;Generalized weakness RLE Deficits / Details: ankle DF/PF 5/5; grossly 3/5 for hip and knee, pt has proximal R UE edema medially and laterally extending to the R knee  with no reports of TTP, pt states pain in grion as well, pt unable to obtain R hip extension when in standing due to pain, noted R proximal LE warm to touch RLE Sensation: WNL    Cervical / Trunk Assessment Cervical / Trunk Assessment: Normal  Communication   Communication Communication: No apparent difficulties    Cognition Arousal: Alert Behavior During Therapy: WFL for tasks assessed/performed   PT - Cognitive impairments: No apparent impairments                         Following commands: Intact       Cueing       General Comments General comments (skin integrity, edema, etc.): R distal UE edema, rubor and calor, R proximal LE edema and calor    Exercises     Assessment/Plan    PT Assessment Patient needs continued PT services  PT Problem List Decreased strength;Decreased range of motion;Decreased activity tolerance;Decreased balance;Decreased mobility;Decreased  coordination;Pain       PT Treatment Interventions DME instruction;Gait training;Stair training;Functional mobility training;Therapeutic activities;Therapeutic exercise;Balance training;Neuromuscular re-education;Patient/family education;Modalities    PT Goals (Current goals can be found in the Care Plan section)  Acute Rehab PT Goals Patient Stated Goal: to no longer have this pain PT Goal Formulation: With patient Time For Goal Achievement: 04/06/24 Potential to Achieve Goals: Good    Frequency Min 3X/week     Co-evaluation               AM-PAC PT 6 Clicks Mobility  Outcome Measure Help needed turning from your back to your side while in a flat bed without using bedrails?: A Little Help needed moving from lying on your back to sitting on the side of a flat bed without using bedrails?: A Little Help needed moving to and from a bed to a chair (including a wheelchair)?: A Little Help needed standing up from a chair using your arms (e.g., wheelchair or bedside chair)?: A Little Help  needed to walk in hospital room?: Total Help needed climbing 3-5 steps with a railing? : Total 6 Click Score: 14    End of Session Equipment Utilized During Treatment: Gait belt Activity Tolerance: Patient limited by pain;Patient limited by fatigue Patient left: in bed;with call bell/phone within reach;with family/visitor present Nurse Communication: Mobility status PT Visit Diagnosis: Unsteadiness on feet (R26.81);Other abnormalities of gait and mobility (R26.89);Muscle weakness (generalized) (M62.81);Pain;Difficulty in walking, not elsewhere classified (R26.2) Pain - Right/Left: Right Pain - part of body: Arm;Hand;Hip;Leg    Time: 8266-8243 PT Time Calculation (min) (ACUTE ONLY): 23 min   Charges:   PT Evaluation $PT Eval Low Complexity: 1 Low PT Treatments $Therapeutic Activity: 8-22 mins PT General Charges $$ ACUTE PT VISIT: 1 Visit         [place], PT Acute Rehab   [place] VEAR Drone 03/23/2024, 6:18 PM

## 2024-03-23 NOTE — Plan of Care (Signed)
   Problem: Nutrition: Goal: Adequate nutrition will be maintained Outcome: Progressing

## 2024-03-23 NOTE — Plan of Care (Signed)
  Problem: Activity: Goal: Risk for activity intolerance will decrease Outcome: Progressing   Problem: Pain Managment: Goal: General experience of comfort will improve and/or be controlled Outcome: Progressing

## 2024-03-23 NOTE — Progress Notes (Signed)
 Progress Note   Patient: Aaron Willis FMW:[id number] DOB: 1961-02-19 DOA: 03/20/2024  DOS: the patient was seen and examined on 03/23/2024   Brief hospital course:  63 y.o. male with medical history significant of hypertension, use who presented to the emergency department with right hip pain and arm pain.    Assessment and Plan:   Lytic lesions in right acetabulum/right hip pain - MRI pelvis noting aggressive lytic expansile 3.7 x 2.7 x 3.2 cm mass in the right upper acetabulum with surrounding marrow edema, enhancement, and cortical demineralization/thinning.  Concern for lytic multiple myeloma or metastatic disease.  CT chest abdomen pelvis unrevealing.  No hypercalcemia, anemia, renal dysfunction to suggest MM.  Oncology consulted and following closely.  Pain control still initiated despite as needed Dilaudid , oxycodone  10 mg every 6 hours as needed.  Patient has allergy to NSAIDs, caused hypertensive emergency/urgency necessitating ER visit.   Right wrist pain-concern for developing cellulitis - X-ray imaging unremarkable.  However does appear to be mildly edematous and slightly erythematous.  Patient denies any injury or fall.  CT of the wrist and hand noting only arthritic changes.  Erythema and edema appear worse this morning.  Concern for cellulitis versus underlying inflammatory arthritis.  Placing on empiric cefazolin, ice, elevation, Voltaren gel.  Will order uric acid.  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  Does not appear to be acute.  However may be contributing to underlying pain or weakness.  PT consult ordered.   Physical debilitation and muscle weakness - Exacerbated by pain from above.  Opioid therapy on board.  PT ordered.   Subjective: Patient sitting at the edge of the bed.  States his right wrist and hand appear slightly more red and swollen.  Still having a lot of pain.  Still having pain in his hip as well.  Mildly  improved with opioid therapy but definitely not controlled.  Otherwise denies shortness of breath, fever, chills, chest pain, nausea, vomiting, abdominal pain.  Physical Exam:  Vitals:   03/22/24 0505 03/22/24 1328 03/22/24 2056 03/23/24 0611  BP: (!) 172/84 (!) 177/80 (!) 164/79 (!) 157/87  Pulse: 62 99 (!) 106 (!) 49  Resp: 17 20 18 18   Temp: 99.4 F (37.4 C) 99.3 F (37.4 C) 98.1 F (36.7 C) 99.1 F (37.3 C)  TempSrc:   Oral Oral  SpO2: 91% 98% 93% 97%  Weight:      Height:        GENERAL:  Alert, pleasant, mild acute distress  HEENT:  EOMI CARDIOVASCULAR:  RRR, no murmurs appreciated RESPIRATORY:  Clear to auscultation, no wheezing, rales, or rhonchi GASTROINTESTINAL:  Soft, nontender, nondistended EXTREMITIES: Right wrist with slightly worsening edema and erythema, ROM limited by pain NEURO:  No new focal deficits appreciated SKIN:  No rashes noted PSYCH:  Appropriate mood and affect    Data Reviewed:  Imaging Studies: CT HAND RIGHT W WO CONTRAST Result Date: 03/23/2024 EXAM: CT RIGHT HAND, WITH AND WITHOUT IV CONTRAST TECHNIQUE: Axial images were acquired through the right hand without and with IV contrast. 75 mL of Omnipaque  300 was administered intravenously. Reformatted images were reviewed. Automated exposure control, iterative reconstruction, and/or weight based adjustment of the mA/kV was utilized to reduce the radiation dose to as low as reasonably achievable. COMPARISON: None available. CLINICAL HISTORY: Hand pain, chronic, inflammatory arthritis suspected, xray done. FINDINGS: BONES AND JOINTS: No acute fracture or focal osseous lesion. No dislocation. Osteoarthritis observed with  mild spurring and interarticular space narrowing of the interphalangeal joints with more striking degenerative spurring of the interphalangeal joint of the thumb. Mild spurring of the 1st metatarsal head and minimal spurring at the 1st carpometacarpal articulation. Radiocarpal  osteoarthritis noted with particular loss of articular space between the distal radial articular surface and the lunate with associated degenerative subcortical cyst formation and subcortical sclerosis along the articulation between the distal radius and lunate as on image 27 series 7. Small degenerative subcortical cyst in the distal pole of the scaphoid. Mild spurring at the articulation of the scaphoid and the trapezium. Degenerative subcortical cyst in the head of the 3rd metacarpal. No well defined erosions. Dorsal spurring of the distal radial articular margin on image 46 series 8 with a chronically fragmented spur of the anterior radial lip on the same image. SOFT TISSUES: The soft tissues are unremarkable. IMPRESSION: 1. Osteoarthritis involving the interphalangeal joints (including the thumb interphalangeal joint), radiocarpal joint with distal radiuslunate joint space loss and associated subcortical cysts and sclerosis, scaphoid-trapezium articulation, and mild spurring at the 1st carpometacarpal articulation, consistent with degenerative arthropathy. Electronically signed by: Ryan Salvage MD 03/23/2024 10:42 AM EST RP Workstation: [id number]   CT CHEST ABDOMEN PELVIS W CONTRAST Result Date: 03/21/2024 EXAM: CT CHEST, ABDOMEN AND PELVIS WITH CONTRAST 03/21/2024 03:11:07 AM TECHNIQUE: CT of the chest, abdomen and pelvis was performed with the administration of 100 mL of iohexol  (OMNIPAQUE ) 300 MG/ML solution intravenous contrast. Multiplanar reformatted images are provided for review. Automated exposure control, iterative reconstruction, and/or weight based adjustment of the mA/kV was utilized to reduce the radiation dose to as low as reasonably achievable. COMPARISON: Plain film and MRI from earlier in the same day. CLINICAL HISTORY: Lytic lesion within the pelvis, evaluate for additional lytic lesions . FINDINGS: CHEST: MEDIASTINUM AND LYMPH NODES: Heart is within normal limits. No coronary  calcifications are noted. Pericardium is unremarkable. The central airways are clear. No mediastinal, hilar or axillary lymphadenopathy. The thoracic aorta is within normal limits. The pulmonary artery is visualized and is within normal limits. The esophagus as visualized is within normal limits. Thoracic inlet is within normal limits. LUNGS AND PLEURA: Lungs are well aerated bilaterally. Patchy atelectatic changes are noted in bases bilaterally. No sizable parenchymal nodule is noted. No pleural effusion or pneumothorax. ABDOMEN AND PELVIS: LIVER: Liver is within normal limits. GALLBLADDER AND BILE DUCTS: Gallbladder is within normal limits. No biliary ductal dilatation. SPLEEN: Spleen is unremarkable. PANCREAS: Pancreas is unremarkable. ADRENAL GLANDS: The adrenal glands are well visualized and within normal limits. KIDNEYS, URETERS AND BLADDER: Kidneys are well visualized and within normal limits with the exception of a tiny cyst within the left kidney. No follow-up is recommended. No stones in the kidneys or ureters. The bladder is partially distended. GI AND BOWEL: Stomach and small bowel are within normal limits. No obstructive or inflammatory changes of the colon are noted. The appendix is within normal limits. There is no bowel obstruction. REPRODUCTIVE ORGANS: No acute abnormality. A small cyst is noted centrally within the seminal vesicle. PERITONEUM AND RETROPERITONEUM: No ascites. No free air. VASCULATURE: Aorta is normal in caliber. ABDOMINAL AND PELVIS LYMPH NODES: No lymphadenopathy. BONES AND SOFT TISSUES: Bony structures show a lytic lesion within the right acetabulum superiorly similar to that seen on recent MRI. Lytic changes are noted along the inferior aspect of the L5 vertebral body again, likely representing a schmorl's node. No other lytic lesions are seen. No focal soft tissue abnormality. IMPRESSION: 1. Lytic lesion within  the right acetabulum superiorly, similar to recent MRI, with no  additional lytic lesions identified. Electronically signed by: Oneil Devonshire MD 03/21/2024 03:28 AM EST RP Workstation: [id number]   MR PELVIS W WO CONTRAST Result Date: 03/20/2024 EXAM: MRI OF THE PELVIS WITH AND WITHOUT INTRAVENOUS CONTRAST 03/20/2024 05:09:16 PM TECHNIQUE: Multiplanar magnetic resonance images of the pelvis with and without intravenous contrast. COMPARISON: None available. CLINICAL HISTORY: Aggressive right acetabular lesion on radiography, right hip pain. FINDINGS: SOFT TISSUES: Low level adjacent edema tracking along the deep margin of the iliopsoas. Low level edema posteriorly in the left greater trochanter likely due to adjacent gluteus medius tendinopathy. JOINTS: No dislocation or significant effusion. LIMITED INTRAPELVIC CONTENTS: Utricle cyst or mullerian remnant along the posterior superior prostate gland. BONES: MRI confirms an aggressive lytic expansile 3.7 x 2.7 x 3.2 cm mass in the right upper acetabulum with surrounding marrow edema and enhancement. There is cortical demineralization/thinning associated with this lesion. Enhancement along the inferior endplate of L5, please see the dedicated lumbar spine MRI report for further characterization. 0.8 cm mildly enhancing lesion in the ischial tuberosity on image 43 series 8, probably a hemangioma given the T1 signal characteristics, although technically nonspecific due to small size. Metal artifact from intramedullary nail in the right femur. IMPRESSION: 1. Aggressive lytic expansile 3.7 x 2.7 x 3.2 cm mass in the right upper acetabulum with surrounding marrow edema, enhancement, and cortical demineralization/thinning. Lytic metastatic disease or myeloma are the top differential diagnostic considerations, correlate with serologic indicators in assessing for myeloma, and consider further technical imaging in the context of oncology workup. 2. 0.8 cm mildly enhancing lesion in the right ischial tuberosity, probably a hemangioma but  technically nonspecific due to small size. Recommend attention on follow-up imaging. 3. Low level edema posteriorly in the left greater trochanter, likely due to adjacent gluteus medius tendinopathy. Electronically signed by: Ryan Salvage MD 03/20/2024 05:37 PM EST RP Workstation: [id number]   MR LUMBAR SPINE WO CONTRAST Result Date: 03/20/2024 EXAM: MRI LUMBAR SPINE 03/20/2024 05:09:16 PM TECHNIQUE: Multiplanar multisequence MRI of the lumbar spine was performed without the administration of intravenous contrast. COMPARISON: None available. CLINICAL HISTORY: Lumbar radiculopathy with right thigh pain. FINDINGS: BONES AND ALIGNMENT: Normal alignment. Normal vertebral body heights. Congenitally short pedicles in the lumbar spine. Focal inferior endplate irregularity with surrounding marrow edema along the inferior endplate of L5, most resembling an acute Schmorl node; however, given findings in the right acetabulum, the possibility of a metastatic lesion along the inferior endplate of L5 is not totally ruled out. Adjacent type 1 and type 2 degenerative endplate findings noted. Background marrow signal is unremarkable unless otherwise stated. SPINAL CORD: Normal appearing conus medullaris terminates at L1-L2. SOFT TISSUES: No paraspinal mass. LIMITATIONS/ARTIFACTS: Motion artifact is present, reducing diagnostic sensitivity and specificity. DISC DESICCATION: Disc desiccation at L3-L4, L4-L5, and L1-L2. T12-L1: Unremarkable. L1-L2: Disc bulge. No impingement. L2-L3: Disc bulge and mild facet arthropathy. No impingement. L3-L4: Moderate central stenosis due to disc bulge and short pedicles. L4-L5: Moderate central stenosis with moderate right subarticular lateral recess stenosis and moderate right foraminal stenosis due to disc bulge, right lateral recess and foraminal disc protrusion, facet arthropathy, and short pedicles. L5-S1: Borderline bilateral foraminal stenosis due to short pedicles, disc osteophyte  complex, and facet arthropathy. IMPRESSION: 1. Moderate central stenosis with moderate right subarticular lateral recess stenosis and moderate right foraminal stenosis at L4-5 due to disc bulge, right lateral recess and foraminal disc protrusion, facet arthropathy, and short pedicles. 2. Moderate central stenosis  at L3-4 due to disc bulge and short pedicles. 3. Focal inferior endplate irregularity with surrounding marrow edema along the inferior endplate of L5, most resembling an acute Schmorl's node; metastatic lesion cannot be completely excluded. Electronically signed by: Ryan Salvage MD 03/20/2024 05:28 PM EST RP Workstation: [id number]   DG Femur Min 2 Views Right Result Date: 03/20/2024 EXAM: 2 VIEW(S) XRAY OF THE FEMUR 03/20/2024 01:05:00 PM COMPARISON: 03/29/2010 CLINICAL HISTORY: hip pain/ h/o femur fracture with hardware FINDINGS: BONES AND JOINTS: Intramedullary rod and screws in place traversing healed fracture deformity of mid femoral diaphysis, with single proximal and 2 distal interlocking screws. Demineralization of the upper medial acetabulum, underlying lytic lesion not excluded. This is further discussed in the hip/pelvis radiograph report. No joint dislocation. SOFT TISSUES: The soft tissues are unremarkable. IMPRESSION: 1. Demineralization of the upper medial right acetabulum with possible underlying lytic lesion; refer to today's hip/pelvis radiograph report for further discussion. 2. Intramedullary nail traversing healed mid femoral diaphyseal fracture, unchanged hardware alignment. Electronically signed by: Ryan Salvage MD 03/20/2024 02:46 PM EST RP Workstation: [id number]   DG Hip Unilat With Pelvis 2-3 Views Right Result Date: 03/20/2024 EXAM: 2 or more VIEW(S) XRAY OF THE HIP 03/20/2024 01:05:00 PM COMPARISON: 03/29/2010 CLINICAL HISTORY: hip pain/ h/o femur fracture with hardware FINDINGS: BONES AND JOINTS: Retrograde intramedullary nail in the right femur. Abnormal 2.1  cm lucency in the right upper medial acetabulum with demineralization of the cortical margin along the acetabulum concerning for a possible lytic lesion. Lytic osseous metastatic disease or myeloma not excluded. Dedicated MRI of the bony pelvis is recommended for more complete assessment. Mild degenerative chondral thinning in both hips. The hip joint is maintained. SOFT TISSUES: The soft tissues are unremarkable. IMPRESSION: 1. Abnormal 2.1 cm lucency in the right upper medial acetabulum with cortical demineralization, concerning for a lytic lesion; lytic osseous metastatic disease or myeloma not excluded, recommend dedicated MRI of the bony pelvis for further assessment. 2. Mild degenerative chondral thinning in both hips. Electronically signed by: Ryan Salvage MD 03/20/2024 02:43 PM EST RP Workstation: [id number]   DG Wrist Complete Right Result Date: 03/20/2024 EXAM: 3 OR MORE VIEW(S) XRAY OF THE WRIST 03/20/2024 12:16:00 PM COMPARISON: None available. CLINICAL HISTORY: pain swelling FINDINGS: BONES AND JOINTS: No acute fracture. No focal osseous lesion. No joint dislocation. SOFT TISSUES: Mild soft tissue swelling. IMPRESSION: 1. Mild soft tissue swelling. 2. No acute fracture or dislocation. Electronically signed by: Donnice Mania MD 03/20/2024 12:49 PM EST RP Workstation: [id number]    Results are pending, will review when available.  Previous records (including but not limited to H&P, progress notes, nursing notes, TOC management) were reviewed in assessment of this patient.  Labs: CBC: Recent Labs  Lab 03/20/24 1235 03/20/24 1900 03/22/24 0448  WBC 9.8 12.3* 12.1*  NEUTROABS 7.8* 9.9*  --   HGB 15.6 15.0 14.3  HCT 45.3 43.3 41.9  MCV 97.2 96.2 96.5  PLT 201 196 197   Basic Metabolic Panel: Recent Labs  Lab 03/20/24 1235 03/22/24 0448  NA 137 135  K 4.0 4.3  CL 98 99  CO2 26 24  GLUCOSE 108* 133*  BUN 17 17  CREATININE 1.31* 1.02  CALCIUM  9.8 9.2   Liver Function  Tests: Recent Labs  Lab 03/22/24 1442  AST 19  ALT 8  ALKPHOS 92  BILITOT 0.6  PROT 7.8  ALBUMIN 3.7   CBG: No results for input(s): GLUCAP in the last 168 hours.  Scheduled Meds:  atorvastatin   40 mg Oral Daily   diclofenac  Sodium  2 g Topical QID   enoxaparin  (LOVENOX ) injection  40 mg Subcutaneous Q24H   losartan   50 mg Oral Daily   Continuous Infusions:   ceFAZolin  (ANCEF ) IV     PRN Meds:.acetaminophen , HYDROmorphone  (DILAUDID ) injection, oxyCODONE   Family Communication: None at bedside  Disposition: Status is: Inpatient Remains inpatient appropriate because: Lytic lesion, right wrist pain     Time spent: 45 minutes  Length of inpatient stay: 2 days  Author: Carliss LELON Canales, DO 03/23/2024 10:46 AM  For on call review [email or web address].

## 2024-03-23 NOTE — Consult Note (Cosign Needed Addendum)
 ORTHOPAEDIC CONSULTATION  REQUESTING PHYSICIAN: Lanny Callander MD  Chief Complaint: right hip pain  HPI: Aaron Willis is a 63 y.o. male who complains of sudden onset of pain to the right hip and groin about 4 weeks ago. No recent injury. Worsening balance and mobilization from weakness. Seen at the [facility] but no workup done like the ER here.   Also having sudden R wrist pain but all studies thus far negative for fx.  Imaging shows aggressive lytic expansile 3.7 x 2.7 x 3.2 cm mass in the right upper acetabulum with surrounding marrow edema, enhancement, and cortical demineralization/thinning.    Orthopedics was consulted for evaluation to see if any prophylactic sx needed.    Past Medical History:  Diagnosis Date   Diabetes mellitus without complication (HCC)    Hypertension    Past Surgical History:  Procedure Laterality Date   FEMUR FRACTURE SURGERY Right    KNEE SURGERY Left    Social History   Socioeconomic History   Marital status: Married    Spouse name: Not on file   Number of children: Not on file   Years of education: Not on file   Highest education level: Not on file  Occupational History   Not on file  Tobacco Use   Smoking status: Never   Smokeless tobacco: Not on file  Substance and Sexual Activity   Alcohol use: Yes    Alcohol/week: 1.0 standard drink of alcohol    Types: 1 Cans of beer per week    Comment: Seldom   Drug use: No    Frequency: 3.0 times per week   Sexual activity: Yes  Other Topics Concern   Not on file  Social History Narrative   Not on file   Social Drivers of Health   Financial Resource Strain: Not on file  Food Insecurity: No Food Insecurity (03/21/2024)   Hunger Vital Sign    Worried About Running Out of Food in the Last Year: Never true    Ran Out of Food in the Last Year: Never true  Transportation Needs: No Transportation Needs (03/21/2024)   PRAPARE - Administrator, Civil Service (Medical): No    Lack  of Transportation (Non-Medical): No  Physical Activity: Not on file  Stress: Not on file  Social Connections: Not on file   Family History  Problem Relation Age of Onset   Breast cancer Mother    Allergies  Allergen Reactions   Ibuprofen  Other (See Comments)    It makes my blood pressure go way up    Prior to Admission medications   Medication Sig Start Date End Date Taking? Authorizing Provider  acetaminophen  (TYLENOL ) 500 MG tablet Take 500 mg by mouth every 6 (six) hours as needed.   Yes [provider]  atorvastatin  (LIPITOR) 40 MG tablet Take 40 mg by mouth daily.   Yes [provider]  Empagliflozin-metFORMIN HCl ER 25-1000 MG TB24 Take 1 tablet by mouth daily. 12/07/22  Yes [provider]  losartan  (COZAAR ) 100 MG tablet Take 50 mg by mouth daily.   Yes [provider]  amoxicillin -clavulanate (AUGMENTIN ) 875-125 MG tablet Take 1 tablet by mouth every 12 (twelve) hours. Patient not taking: Reported on 03/21/2024 03/06/23   Melvenia Motto, MD  fluticasone  (FLONASE ) 50 MCG/ACT nasal spray Place 2 sprays into both nostrils daily. Patient taking differently: Place 2 sprays into both nostrils daily as needed for allergies. 12/30/21   Kennyth Domino, FNP  ondansetron  (ZOFRAN -ODT) 8 MG disintegrating tablet Take 1 tablet (8 mg total) by mouth every 8 (eight) hours as needed for nausea or vomiting. Patient not taking: Reported on 03/06/2023 04/14/22   Raford Lenis, MD   CT HAND RIGHT W WO CONTRAST Result Date: 03/23/2024 EXAM: CT RIGHT HAND, WITH AND WITHOUT IV CONTRAST TECHNIQUE: Axial images were acquired through the right hand without and with IV contrast. 75 mL of Omnipaque  300 was administered intravenously. Reformatted images were reviewed. Automated exposure control, iterative reconstruction, and/or weight based adjustment of the mA/kV was utilized to reduce the radiation dose to as low as reasonably achievable. COMPARISON: None available. CLINICAL  HISTORY: Hand pain, chronic, inflammatory arthritis suspected, xray done. FINDINGS: BONES AND JOINTS: No acute fracture or focal osseous lesion. No dislocation. Osteoarthritis observed with mild spurring and interarticular space narrowing of the interphalangeal joints with more striking degenerative spurring of the interphalangeal joint of the thumb. Mild spurring of the 1st metatarsal head and minimal spurring at the 1st carpometacarpal articulation. Radiocarpal osteoarthritis noted with particular loss of articular space between the distal radial articular surface and the lunate with associated degenerative subcortical cyst formation and subcortical sclerosis along the articulation between the distal radius and lunate as on image 27 series 7. Small degenerative subcortical cyst in the distal pole of the scaphoid. Mild spurring at the articulation of the scaphoid and the trapezium. Degenerative subcortical cyst in the head of the 3rd metacarpal. No well defined erosions. Dorsal spurring of the distal radial articular margin on image 46 series 8 with a chronically fragmented spur of the anterior radial lip on the same image. SOFT TISSUES: The soft tissues are unremarkable. IMPRESSION: 1. Osteoarthritis involving the interphalangeal joints (including the thumb interphalangeal joint), radiocarpal joint with distal radiuslunate joint space loss and associated subcortical cysts and sclerosis, scaphoid-trapezium articulation, and mild spurring at the 1st carpometacarpal articulation, consistent with degenerative arthropathy. Electronically signed by: Ryan Salvage MD 03/23/2024 10:42 AM EST RP Workstation: [id number]    Positive ROS: All other systems have been reviewed and were otherwise negative with the exception of those mentioned in the HPI and as above.  Objective: Labs cbc Recent Labs    03/20/24 1900 03/22/24 0448  WBC 12.3* 12.1*  HGB 15.0 14.3  HCT 43.3 41.9  PLT 196 197    Labs inflam No  results for input(s): CRP in the last 72 hours.  Invalid input(s): ESR  Labs coag Recent Labs    03/23/24 1238  INR 1.0    Recent Labs    03/22/24 0448  NA 135  K 4.3  CL 99  CO2 24  GLUCOSE 133*  BUN 17  CREATININE 1.02  CALCIUM  9.2    Physical Exam: Vitals:   03/23/24 0611 03/23/24 1354  BP: (!) 157/87 (!) 159/123  Pulse: (!) 49 67  Resp: 18 20  Temp: 99.1 F (37.3 C) 99.3 F (37.4 C)  SpO2: 97% 99%    General: NAD.  Sitting up on edge of bed, calm Resp: No increased wob Cardio: regular rate and rhythm ABD soft Neurologically intact MSK Neurovascularly intact Sensation intact distally Dorsiflexion/Plantar flexion intact RLE TTP R hip/groin Edema circumferential to R mid-forearm through fingers. Sensation intact but unable to AROM fingers or wrist on R Painful PROM    Assessment / Plan: Principal Problem:   Lytic bone lesion of hip Active Problems:   Acute pain of right wrist   Intractable pain    Have discussed hip lesion with ortho trauma  team and the possibility of prophylactic treatment. Will defer to them. Appears IR team will do bone biopsy Monday.   Sling for RUE for comfort. Encourage AGGRESSIVE elevation of hand on pillows above level of the heart and hand exercises to do on his own if able.  May want to trial some prednisone  to help with swelling. If can get swelling to go down likely get discomfort and lack of AROM to go away. Recommend official hand consult for further investigation if continues.    Weightbearing: WBAT RUE and RLE Pain control: PRN per primary Follow - up plan: TBD   Gerard CHRISTELLA Large PA-C Office [phone] 03/23/2024 5:23 PM

## 2024-03-23 NOTE — Progress Notes (Signed)
 Aaron Willis   DOB:[birth year]   FM#:[id number]   RDW#:[id number]  Medical oncology follow-up note  Subjective: Patient complains of significant pain and swelling of right forearm, wrist and hand.  He is not able to work due to the right hip pain.  No other new complaints.   Objective:  Vitals:   03/23/24 1354 03/23/24 1944  BP: (!) 159/123 (!) 172/101  Pulse: 67 98  Resp: 20 16  Temp: 99.3 F (37.4 C) 98.4 F (36.9 C)  SpO2: 99% 97%    Body mass index is 36.26 kg/m.  Intake/Output Summary (Last 24 hours) at 03/23/2024 2127 Last data filed at 03/23/2024 1946 Gross per 24 hour  Intake 820 ml  Output 2300 ml  Net -1480 ml     Sclerae unicteric  Oropharynx clear  No peripheral adenopathy  Lungs clear -- no rales or rhonchi  Heart regular rate and rhythm  Abdomen benign  MSK showed significant edema of right forearm, wrist and hand, and is warm to touch.  Neuro nonfocal   CBG (last 3)  No results for input(s): GLUCAP in the last 72 hours.   Labs:  Lab Results  Component Value Date   WBC 12.1 (H) 03/22/2024   HGB 14.3 03/22/2024   HCT 41.9 03/22/2024   MCV 96.5 03/22/2024   PLT 197 03/22/2024   NEUTROABS 9.9 (H) 03/20/2024   Urine Studies No results for input(s): UHGB, CRYS in the last 72 hours.  Invalid input(s): UACOL, UAPR, USPG, UPH, UTP, UGL, UKET, UBIL, UNIT, UROB, ULEU, UEPI, UWBC, URBC, UBAC, CAST, UCOM, BILUA  Basic Metabolic Panel: Recent Labs  Lab 03/20/24 1235 03/22/24 0448  NA 137 135  K 4.0 4.3  CL 98 99  CO2 26 24  GLUCOSE 108* 133*  BUN 17 17  CREATININE 1.31* 1.02  CALCIUM  9.8 9.2   GFR Estimated Creatinine Clearance: 83.9 mL/min (by C-G formula based on SCr of 1.02 mg/dL). Liver Function Tests: Recent Labs  Lab 03/22/24 1442  AST 19  ALT 8  ALKPHOS 92  BILITOT 0.6  PROT 7.8  ALBUMIN 3.7   No results for input(s): LIPASE, AMYLASE in the last 168 hours. No results for  input(s): AMMONIA in the last 168 hours. Coagulation profile Recent Labs  Lab 03/23/24 1238  INR 1.0    CBC: Recent Labs  Lab 03/20/24 1235 03/20/24 1900 03/22/24 0448  WBC 9.8 12.3* 12.1*  NEUTROABS 7.8* 9.9*  --   HGB 15.6 15.0 14.3  HCT 45.3 43.3 41.9  MCV 97.2 96.2 96.5  PLT 201 196 197   Cardiac Enzymes: No results for input(s): CKTOTAL, CKMB, CKMBINDEX, TROPONINI in the last 168 hours. BNP: Invalid input(s): POCBNP CBG: No results for input(s): GLUCAP in the last 168 hours. D-Dimer No results for input(s): DDIMER in the last 72 hours. Hgb A1c No results for input(s): HGBA1C in the last 72 hours. Lipid Profile No results for input(s): CHOL, HDL, LDLCALC, TRIG, CHOLHDL, LDLDIRECT in the last 72 hours. Thyroid function studies No results for input(s): TSH, T4TOTAL, T3FREE, THYROIDAB in the last 72 hours.  Invalid input(s): FREET3 Anemia work up No results for input(s): VITAMINB12, FOLATE, FERRITIN, TIBC, IRON, RETICCTPCT in the last 72 hours. Microbiology Recent Results (from the past 240 hours)  Culture, blood (single)     Status: None (Preliminary result)   Collection Time: 03/21/24  1:39 AM   Specimen: BLOOD  Result Value Ref Range Status   Specimen Description   Final    BLOOD  SITE NOT SPECIFIED Performed at [facility], [address] [place], [place] [address]    Special Requests   Final    BOTTLES DRAWN AEROBIC AND ANAEROBIC Blood Culture results may not be optimal due to an inadequate volume of blood received in culture bottles Performed at [facility], [address] [place], [place] [address]    Culture   Final    NO GROWTH 2 DAYS Performed at [facility], [address], [place], [place] [address]    Report Status PENDING  Incomplete      Studies:  CT HAND RIGHT W WO CONTRAST Result Date: 03/23/2024 EXAM: CT RIGHT HAND, WITH AND WITHOUT IV  CONTRAST TECHNIQUE: Axial images were acquired through the right hand without and with IV contrast. 75 mL of Omnipaque  300 was administered intravenously. Reformatted images were reviewed. Automated exposure control, iterative reconstruction, and/or weight based adjustment of the mA/kV was utilized to reduce the radiation dose to as low as reasonably achievable. COMPARISON: None available. CLINICAL HISTORY: Hand pain, chronic, inflammatory arthritis suspected, xray done. FINDINGS: BONES AND JOINTS: No acute fracture or focal osseous lesion. No dislocation. Osteoarthritis observed with mild spurring and interarticular space narrowing of the interphalangeal joints with more striking degenerative spurring of the interphalangeal joint of the thumb. Mild spurring of the 1st metatarsal head and minimal spurring at the 1st carpometacarpal articulation. Radiocarpal osteoarthritis noted with particular loss of articular space between the distal radial articular surface and the lunate with associated degenerative subcortical cyst formation and subcortical sclerosis along the articulation between the distal radius and lunate as on image 27 series 7. Small degenerative subcortical cyst in the distal pole of the scaphoid. Mild spurring at the articulation of the scaphoid and the trapezium. Degenerative subcortical cyst in the head of the 3rd metacarpal. No well defined erosions. Dorsal spurring of the distal radial articular margin on image 46 series 8 with a chronically fragmented spur of the anterior radial lip on the same image. SOFT TISSUES: The soft tissues are unremarkable. IMPRESSION: 1. Osteoarthritis involving the interphalangeal joints (including the thumb interphalangeal joint), radiocarpal joint with distal radiuslunate joint space loss and associated subcortical cysts and sclerosis, scaphoid-trapezium articulation, and mild spurring at the 1st carpometacarpal articulation, consistent with degenerative arthropathy.  Electronically signed by: Ryan Salvage MD 03/23/2024 10:42 AM EST RP Workstation: [id number]    Assessment: 63 y.o. male   Lytic lesions in right acetabulum  Right hip pain and inability to walk, secondary to #1 Right forearm/wrist/hand edema and pain, concerning for infection HTN    Plan:  - His right wrist and hand is swollen and warm to touch, concerning for cellulitis, antibiotics per primary team. - I called orthopedic consult, spoke with on call Dr. Beverley. He does not think prophylactic orthopedic surgery is needed, he will defer to the trauma team. - I have also reach out to IR regarding biopsy, they will attempt a CT-guided right acetabulum bone lesion biopsy on Monday. - PSA is 15, which is similar to his level in the past 2 years.  Patient, he has not had prostate biopsy several months ago, which was negative for prostate cancer. - Multiple myeloma lab notes are still pending. -I will follow-up next Tuesday when his biopsy result is back  -PT and pain management per primary team    Onita Mattock, MD 03/23/2024

## 2024-03-23 NOTE — Consult Note (Signed)
 Chief Complaint: Right hand/wrist/arm pain/swelling; right hip pain; lytic lesion right right acetabulum  Referring Provider(s): Feng,Y  Supervising Physician: Johann Sieving  Patient Status: [facility]  History of Present Illness: Aaron Willis is a 63 y.o. male with past medical history significant for diabetes, hypertension, elevated PSA who recently presented to [facility] with progressive right hip pain/right wrist/arm pain/edema.  Imaging has revealed lytic bone lesion in the right acetabulum.  Patient has no prior history of malignancy.  He was seen by oncology and request now received for right acetabular lytic lesion biopsy.  Latest labs include WBC 12.1, hemoglobin normal, platelets normal, PSA 15, PT/INR normal, creatinine normal, blood culture negative to date, multiple myeloma panel.    Patient is Full Code  Past Medical History:  Diagnosis Date   Diabetes mellitus without complication (HCC)    Hypertension     Past Surgical History:  Procedure Laterality Date   FEMUR FRACTURE SURGERY Right    KNEE SURGERY Left     Allergies: Ibuprofen   Medications: Prior to Admission medications   Medication Sig Start Date End Date Taking? Authorizing Provider  acetaminophen  (TYLENOL ) 500 MG tablet Take 500 mg by mouth every 6 (six) hours as needed.   Yes [provider]  atorvastatin  (LIPITOR) 40 MG tablet Take 40 mg by mouth daily.   Yes [provider]  Empagliflozin-metFORMIN HCl ER 25-1000 MG TB24 Take 1 tablet by mouth daily. 12/07/22  Yes [provider]  losartan  (COZAAR ) 100 MG tablet Take 50 mg by mouth daily.   Yes [provider]  amoxicillin -clavulanate (AUGMENTIN ) 875-125 MG tablet Take 1 tablet by mouth every 12 (twelve) hours. Patient not taking: Reported on 03/21/2024 03/06/23   Melvenia Motto, MD  fluticasone  (FLONASE ) 50 MCG/ACT nasal spray Place 2 sprays into both nostrils daily. Patient taking differently:  Place 2 sprays into both nostrils daily as needed for allergies. 12/30/21   Kennyth Domino, FNP  ondansetron  (ZOFRAN -ODT) 8 MG disintegrating tablet Take 1 tablet (8 mg total) by mouth every 8 (eight) hours as needed for nausea or vomiting. Patient not taking: Reported on 03/06/2023 04/14/22   Raford Lenis, MD     Family History  Problem Relation Age of Onset   Breast cancer Mother     Social History   Socioeconomic History   Marital status: Married    Spouse name: Not on file   Number of children: Not on file   Years of education: Not on file   Highest education level: Not on file  Occupational History   Not on file  Tobacco Use   Smoking status: Never   Smokeless tobacco: Not on file  Substance and Sexual Activity   Alcohol use: Yes    Alcohol/week: 1.0 standard drink of alcohol    Types: 1 Cans of beer per week    Comment: Seldom   Drug use: No    Frequency: 3.0 times per week   Sexual activity: Yes  Other Topics Concern   Not on file  Social History Narrative   Not on file   Social Drivers of Health   Financial Resource Strain: Not on file  Food Insecurity: No Food Insecurity (03/21/2024)   Hunger Vital Sign    Worried About Running Out of Food in the Last Year: Never true    Ran Out of Food in the Last Year: Never true  Transportation Needs: No Transportation Needs (03/21/2024)   PRAPARE - Transportation    Lack  of Transportation (Medical): No    Lack of Transportation (Non-Medical): No  Physical Activity: Not on file  Stress: Not on file  Social Connections: Not on file       Review of Systems see above, currently denies fever, headache, chest pain, dyspnea, cough, abdominal pain, nausea, vomiting or bleeding.  In addition to above he also has some occ low back pain.  Vital Signs: BP (!) 157/87 (BP Location: Right Arm)   Pulse (!) 49   Temp 99.1 F (37.3 C) (Oral)   Resp 18   Ht 5' 6 (1.676 m)   Wt 224 lb 10.4 oz (101.9 kg)   SpO2 97%   BMI 36.26  kg/m   Advance Care Plan: No documents on file    Physical Exam: Awake, alert.  Chest clear to auscultation bilaterally.  Heart with normal rate, ectopy noted.  Abdomen soft, positive bowel sounds, nontender.  No lower extremity edema.  Swollen right arm notably forearm/wrist/hand   Imaging: CT HAND RIGHT W WO CONTRAST Result Date: 03/23/2024 EXAM: CT RIGHT HAND, WITH AND WITHOUT IV CONTRAST TECHNIQUE: Axial images were acquired through the right hand without and with IV contrast. 75 mL of Omnipaque  300 was administered intravenously. Reformatted images were reviewed. Automated exposure control, iterative reconstruction, and/or weight based adjustment of the mA/kV was utilized to reduce the radiation dose to as low as reasonably achievable. COMPARISON: None available. CLINICAL HISTORY: Hand pain, chronic, inflammatory arthritis suspected, xray done. FINDINGS: BONES AND JOINTS: No acute fracture or focal osseous lesion. No dislocation. Osteoarthritis observed with mild spurring and interarticular space narrowing of the interphalangeal joints with more striking degenerative spurring of the interphalangeal joint of the thumb. Mild spurring of the 1st metatarsal head and minimal spurring at the 1st carpometacarpal articulation. Radiocarpal osteoarthritis noted with particular loss of articular space between the distal radial articular surface and the lunate with associated degenerative subcortical cyst formation and subcortical sclerosis along the articulation between the distal radius and lunate as on image 27 series 7. Small degenerative subcortical cyst in the distal pole of the scaphoid. Mild spurring at the articulation of the scaphoid and the trapezium. Degenerative subcortical cyst in the head of the 3rd metacarpal. No well defined erosions. Dorsal spurring of the distal radial articular margin on image 46 series 8 with a chronically fragmented spur of the anterior radial lip on the same image. SOFT  TISSUES: The soft tissues are unremarkable. IMPRESSION: 1. Osteoarthritis involving the interphalangeal joints (including the thumb interphalangeal joint), radiocarpal joint with distal radiuslunate joint space loss and associated subcortical cysts and sclerosis, scaphoid-trapezium articulation, and mild spurring at the 1st carpometacarpal articulation, consistent with degenerative arthropathy. Electronically signed by: Ryan Salvage MD 03/23/2024 10:42 AM EST RP Workstation: [id number]   CT CHEST ABDOMEN PELVIS W CONTRAST Result Date: 03/21/2024 EXAM: CT CHEST, ABDOMEN AND PELVIS WITH CONTRAST 03/21/2024 03:11:07 AM TECHNIQUE: CT of the chest, abdomen and pelvis was performed with the administration of 100 mL of iohexol  (OMNIPAQUE ) 300 MG/ML solution intravenous contrast. Multiplanar reformatted images are provided for review. Automated exposure control, iterative reconstruction, and/or weight based adjustment of the mA/kV was utilized to reduce the radiation dose to as low as reasonably achievable. COMPARISON: Plain film and MRI from earlier in the same day. CLINICAL HISTORY: Lytic lesion within the pelvis, evaluate for additional lytic lesions . FINDINGS: CHEST: MEDIASTINUM AND LYMPH NODES: Heart is within normal limits. No coronary calcifications are noted. Pericardium is unremarkable. The central airways are clear. No mediastinal, hilar  or axillary lymphadenopathy. The thoracic aorta is within normal limits. The pulmonary artery is visualized and is within normal limits. The esophagus as visualized is within normal limits. Thoracic inlet is within normal limits. LUNGS AND PLEURA: Lungs are well aerated bilaterally. Patchy atelectatic changes are noted in bases bilaterally. No sizable parenchymal nodule is noted. No pleural effusion or pneumothorax. ABDOMEN AND PELVIS: LIVER: Liver is within normal limits. GALLBLADDER AND BILE DUCTS: Gallbladder is within normal limits. No biliary ductal dilatation.  SPLEEN: Spleen is unremarkable. PANCREAS: Pancreas is unremarkable. ADRENAL GLANDS: The adrenal glands are well visualized and within normal limits. KIDNEYS, URETERS AND BLADDER: Kidneys are well visualized and within normal limits with the exception of a tiny cyst within the left kidney. No follow-up is recommended. No stones in the kidneys or ureters. The bladder is partially distended. GI AND BOWEL: Stomach and small bowel are within normal limits. No obstructive or inflammatory changes of the colon are noted. The appendix is within normal limits. There is no bowel obstruction. REPRODUCTIVE ORGANS: No acute abnormality. A small cyst is noted centrally within the seminal vesicle. PERITONEUM AND RETROPERITONEUM: No ascites. No free air. VASCULATURE: Aorta is normal in caliber. ABDOMINAL AND PELVIS LYMPH NODES: No lymphadenopathy. BONES AND SOFT TISSUES: Bony structures show a lytic lesion within the right acetabulum superiorly similar to that seen on recent MRI. Lytic changes are noted along the inferior aspect of the L5 vertebral body again, likely representing a schmorl's node. No other lytic lesions are seen. No focal soft tissue abnormality. IMPRESSION: 1. Lytic lesion within the right acetabulum superiorly, similar to recent MRI, with no additional lytic lesions identified. Electronically signed by: Oneil Devonshire MD 03/21/2024 03:28 AM EST RP Workstation: [id number]   MR PELVIS W WO CONTRAST Result Date: 03/20/2024 EXAM: MRI OF THE PELVIS WITH AND WITHOUT INTRAVENOUS CONTRAST 03/20/2024 05:09:16 PM TECHNIQUE: Multiplanar magnetic resonance images of the pelvis with and without intravenous contrast. COMPARISON: None available. CLINICAL HISTORY: Aggressive right acetabular lesion on radiography, right hip pain. FINDINGS: SOFT TISSUES: Low level adjacent edema tracking along the deep margin of the iliopsoas. Low level edema posteriorly in the left greater trochanter likely due to adjacent gluteus medius  tendinopathy. JOINTS: No dislocation or significant effusion. LIMITED INTRAPELVIC CONTENTS: Utricle cyst or mullerian remnant along the posterior superior prostate gland. BONES: MRI confirms an aggressive lytic expansile 3.7 x 2.7 x 3.2 cm mass in the right upper acetabulum with surrounding marrow edema and enhancement. There is cortical demineralization/thinning associated with this lesion. Enhancement along the inferior endplate of L5, please see the dedicated lumbar spine MRI report for further characterization. 0.8 cm mildly enhancing lesion in the ischial tuberosity on image 43 series 8, probably a hemangioma given the T1 signal characteristics, although technically nonspecific due to small size. Metal artifact from intramedullary nail in the right femur. IMPRESSION: 1. Aggressive lytic expansile 3.7 x 2.7 x 3.2 cm mass in the right upper acetabulum with surrounding marrow edema, enhancement, and cortical demineralization/thinning. Lytic metastatic disease or myeloma are the top differential diagnostic considerations, correlate with serologic indicators in assessing for myeloma, and consider further technical imaging in the context of oncology workup. 2. 0.8 cm mildly enhancing lesion in the right ischial tuberosity, probably a hemangioma but technically nonspecific due to small size. Recommend attention on follow-up imaging. 3. Low level edema posteriorly in the left greater trochanter, likely due to adjacent gluteus medius tendinopathy. Electronically signed by: Ryan Salvage MD 03/20/2024 05:37 PM EST RP Workstation: [id number]   MR  LUMBAR SPINE WO CONTRAST Result Date: 03/20/2024 EXAM: MRI LUMBAR SPINE 03/20/2024 05:09:16 PM TECHNIQUE: Multiplanar multisequence MRI of the lumbar spine was performed without the administration of intravenous contrast. COMPARISON: None available. CLINICAL HISTORY: Lumbar radiculopathy with right thigh pain. FINDINGS: BONES AND ALIGNMENT: Normal alignment. Normal  vertebral body heights. Congenitally short pedicles in the lumbar spine. Focal inferior endplate irregularity with surrounding marrow edema along the inferior endplate of L5, most resembling an acute Schmorl node; however, given findings in the right acetabulum, the possibility of a metastatic lesion along the inferior endplate of L5 is not totally ruled out. Adjacent type 1 and type 2 degenerative endplate findings noted. Background marrow signal is unremarkable unless otherwise stated. SPINAL CORD: Normal appearing conus medullaris terminates at L1-L2. SOFT TISSUES: No paraspinal mass. LIMITATIONS/ARTIFACTS: Motion artifact is present, reducing diagnostic sensitivity and specificity. DISC DESICCATION: Disc desiccation at L3-L4, L4-L5, and L1-L2. T12-L1: Unremarkable. L1-L2: Disc bulge. No impingement. L2-L3: Disc bulge and mild facet arthropathy. No impingement. L3-L4: Moderate central stenosis due to disc bulge and short pedicles. L4-L5: Moderate central stenosis with moderate right subarticular lateral recess stenosis and moderate right foraminal stenosis due to disc bulge, right lateral recess and foraminal disc protrusion, facet arthropathy, and short pedicles. L5-S1: Borderline bilateral foraminal stenosis due to short pedicles, disc osteophyte complex, and facet arthropathy. IMPRESSION: 1. Moderate central stenosis with moderate right subarticular lateral recess stenosis and moderate right foraminal stenosis at L4-5 due to disc bulge, right lateral recess and foraminal disc protrusion, facet arthropathy, and short pedicles. 2. Moderate central stenosis at L3-4 due to disc bulge and short pedicles. 3. Focal inferior endplate irregularity with surrounding marrow edema along the inferior endplate of L5, most resembling an acute Schmorl's node; metastatic lesion cannot be completely excluded. Electronically signed by: Ryan Salvage MD 03/20/2024 05:28 PM EST RP Workstation: [id number]   DG Femur Min 2  Views Right Result Date: 03/20/2024 EXAM: 2 VIEW(S) XRAY OF THE FEMUR 03/20/2024 01:05:00 PM COMPARISON: 03/29/2010 CLINICAL HISTORY: hip pain/ h/o femur fracture with hardware FINDINGS: BONES AND JOINTS: Intramedullary rod and screws in place traversing healed fracture deformity of mid femoral diaphysis, with single proximal and 2 distal interlocking screws. Demineralization of the upper medial acetabulum, underlying lytic lesion not excluded. This is further discussed in the hip/pelvis radiograph report. No joint dislocation. SOFT TISSUES: The soft tissues are unremarkable. IMPRESSION: 1. Demineralization of the upper medial right acetabulum with possible underlying lytic lesion; refer to today's hip/pelvis radiograph report for further discussion. 2. Intramedullary nail traversing healed mid femoral diaphyseal fracture, unchanged hardware alignment. Electronically signed by: Ryan Salvage MD 03/20/2024 02:46 PM EST RP Workstation: [id number]   DG Hip Unilat With Pelvis 2-3 Views Right Result Date: 03/20/2024 EXAM: 2 or more VIEW(S) XRAY OF THE HIP 03/20/2024 01:05:00 PM COMPARISON: 03/29/2010 CLINICAL HISTORY: hip pain/ h/o femur fracture with hardware FINDINGS: BONES AND JOINTS: Retrograde intramedullary nail in the right femur. Abnormal 2.1 cm lucency in the right upper medial acetabulum with demineralization of the cortical margin along the acetabulum concerning for a possible lytic lesion. Lytic osseous metastatic disease or myeloma not excluded. Dedicated MRI of the bony pelvis is recommended for more complete assessment. Mild degenerative chondral thinning in both hips. The hip joint is maintained. SOFT TISSUES: The soft tissues are unremarkable. IMPRESSION: 1. Abnormal 2.1 cm lucency in the right upper medial acetabulum with cortical demineralization, concerning for a lytic lesion; lytic osseous metastatic disease or myeloma not excluded, recommend dedicated MRI of the bony pelvis for  further  assessment. 2. Mild degenerative chondral thinning in both hips. Electronically signed by: Ryan Salvage MD 03/20/2024 02:43 PM EST RP Workstation: [id number]   DG Wrist Complete Right Result Date: 03/20/2024 EXAM: 3 OR MORE VIEW(S) XRAY OF THE WRIST 03/20/2024 12:16:00 PM COMPARISON: None available. CLINICAL HISTORY: pain swelling FINDINGS: BONES AND JOINTS: No acute fracture. No focal osseous lesion. No joint dislocation. SOFT TISSUES: Mild soft tissue swelling. IMPRESSION: 1. Mild soft tissue swelling. 2. No acute fracture or dislocation. Electronically signed by: Donnice Mania MD 03/20/2024 12:49 PM EST RP Workstation: [id number]    Labs:  CBC: Recent Labs    03/20/24 1235 03/20/24 1900 03/22/24 0448  WBC 9.8 12.3* 12.1*  HGB 15.6 15.0 14.3  HCT 45.3 43.3 41.9  PLT 201 196 197    COAGS: Recent Labs    03/23/24 1238  INR 1.0    BMP: Recent Labs    03/20/24 1235 03/22/24 0448  NA 137 135  K 4.0 4.3  CL 98 99  CO2 26 24  GLUCOSE 108* 133*  BUN 17 17  CALCIUM  9.8 9.2  CREATININE 1.31* 1.02  GFRNONAA >60 >60    LIVER FUNCTION TESTS: Recent Labs    03/22/24 1442  BILITOT 0.6  AST 19  ALT 8  ALKPHOS 92  PROT 7.8  ALBUMIN 3.7    TUMOR MARKERS: No results for input(s): AFPTM, CEA, CA199, CHROMGRNA in the last 8760 hours.  Assessment and Plan: 63 y.o. male with past medical history significant for diabetes, hypertension, elevated PSA who recently presented to [facility] with progressive right hip pain/right wrist/arm pain/edema.  Imaging has revealed lytic bone lesion in the right acetabulum.  Patient has no prior history of malignancy.  He was seen by oncology and request now received for right acetabular lytic lesion biopsy.  Latest labs include WBC 12.1, hemoglobin normal, platelets normal, PSA 15, PT/INR normal, creatinine normal, blood culture negative to date, multiple myeloma panel.imaging studies were reviewed by Dr. Johann.  Risks  and benefits of procedure was discussed with the patient  including, but not limited to bleeding, infection, damage to adjacent structures or low yield requiring additional tests.  All of the questions were answered and there is agreement to proceed.  Consent signed and in chart.  Procedure tent planned for 12/1  Thank you for allowing our service to participate in Aaron Willis 's care.  Electronically Signed: D. Franky Rakers, PA-C   03/23/2024, 1:38 PM      I spent a total of  25 minutes   in face to face in clinical consultation, greater than 50% of which was counseling/coordinating care for image guided right acetabular lytic lesion biopsy

## 2024-03-23 NOTE — Hospital Course (Addendum)
 Lytic lesions in right acetabulum/right hip pain - MRI pelvis noting aggressive lytic expansile 3.7 x 2.7 x 3.2 cm mass in the right upper acetabulum with surrounding marrow edema, enhancement, and cortical demineralization/thinning.  Concern for lytic multiple myeloma or metastatic disease.  CT chest abdomen pelvis unrevealing.  No hypercalcemia, anemia, renal dysfunction to suggest MM.  Oncology consulted and following closely.  Pain control improved with as needed Dilaudid , oxycodone  10 mg every 6 hours as needed.  Patient has allergy to NSAIDs, caused hypertensive emergency/urgency necessitating ER visit.  Ortho and interventional radiology consulted.  IR to attempt a biopsy on Monday 12/1.   Right wrist pain-concern for developing cellulitis - X-ray imaging unremarkable.  However does appear to be edematous and slightly erythematous.  Patient denies any injury or fall.  CT of the wrist and hand noting only arthritic changes.  No leukocytosis or fever either.  Erythema and edema appear worse again this morning.  Differentials include cellulitis versus underlying inflammatory arthritis.  Uric acid normal.  Initiated empiric cefazolin  11/28 with ice, elevation, Voltaren  gel.  Will give p.o. colchicine  1.2 mg once followed by 0.6 mg an hour later (allergy to NSAIDs).  Will order MRI of the wrist.  Appears to be the main source of patient's pain the last 2 days.   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  Does not appear to be acute.  However may be contributing to underlying pain or weakness.  PT following.   Physical debilitation and muscle weakness - Exacerbated by pain from above.  Opioid therapy on board.  PT following.

## 2024-03-24 ENCOUNTER — Inpatient Hospital Stay (HOSPITAL_COMMUNITY)

## 2024-03-24 LAB — CBC
HCT: 44.8 % (ref 39.0–52.0)
Hemoglobin: 14.9 g/dL (ref 13.0–17.0)
MCH: 32.3 pg (ref 26.0–34.0)
MCHC: 33.3 g/dL (ref 30.0–36.0)
MCV: 97.2 fL (ref 80.0–100.0)
Platelets: 203 K/uL (ref 150–400)
RBC: 4.61 MIL/uL (ref 4.22–5.81)
RDW: 14.7 % (ref 11.5–15.5)
WBC: 8.7 K/uL (ref 4.0–10.5)
nRBC: 0 % (ref 0.0–0.2)

## 2024-03-24 LAB — COMPREHENSIVE METABOLIC PANEL WITH GFR
ALT: 26 U/L (ref 0–44)
AST: 30 U/L (ref 15–41)
Albumin: 3.5 g/dL (ref 3.5–5.0)
Alkaline Phosphatase: 65 U/L (ref 38–126)
Anion gap: 9 (ref 5–15)
BUN: 16 mg/dL (ref 8–23)
CO2: 28 mmol/L (ref 22–32)
Calcium: 9 mg/dL (ref 8.9–10.3)
Chloride: 99 mmol/L (ref 98–111)
Creatinine, Ser: 1.04 mg/dL (ref 0.61–1.24)
GFR, Estimated: >60 mL/min (ref >60)
Glucose, Bld: 150 mg/dL — ABNORMAL HIGH (ref 70–99)
Potassium: 4.6 mmol/L (ref 3.5–5.1)
Sodium: 137 mmol/L (ref 135–145)
Total Bilirubin: 0.5 mg/dL (ref 0.0–1.2)
Total Protein: 7.7 g/dL (ref 6.5–8.1)

## 2024-03-24 MED ORDER — COLCHICINE 0.6 MG PO TABS
1.2000 mg | ORAL_TABLET | Freq: Once | ORAL | Status: AC
  Administered 2024-03-24: 1.2 mg via ORAL
  Filled 2024-03-24: qty 2

## 2024-03-24 MED ORDER — GADOBUTROL 1 MMOL/ML IV SOLN
10.0000 mL | Freq: Once | INTRAVENOUS | Status: AC | PRN
  Administered 2024-03-24: 10 mL via INTRAVENOUS

## 2024-03-24 MED ORDER — COLCHICINE 0.6 MG PO TABS
0.6000 mg | ORAL_TABLET | Freq: Once | ORAL | Status: AC
  Administered 2024-03-24: 0.6 mg via ORAL
  Filled 2024-03-24: qty 1

## 2024-03-24 NOTE — Progress Notes (Signed)
 Progress Note   Patient: Aaron Willis FMW:[id number] DOB: 1960/05/04 DOA: 03/20/2024  DOS: the patient was seen and examined on 03/24/2024   Brief hospital course:  63 y.o. male with medical history significant of hypertension, use who presented to the emergency department with right hip pain and arm pain.    Assessment and Plan:   Lytic lesions in right acetabulum/right hip pain - MRI pelvis noting aggressive lytic expansile 3.7 x 2.7 x 3.2 cm mass in the right upper acetabulum with surrounding marrow edema, enhancement, and cortical demineralization/thinning.  Concern for lytic multiple myeloma or metastatic disease.  CT chest abdomen pelvis unrevealing.  No hypercalcemia, anemia, renal dysfunction to suggest MM.  Oncology consulted and following closely.  Pain control improved with as needed Dilaudid , oxycodone  10 mg every 6 hours as needed.  Patient has allergy to NSAIDs, caused hypertensive emergency/urgency necessitating ER visit.  Ortho and interventional radiology consulted.  IR to attempt a biopsy on Monday 12/1.   Right wrist pain-concern for developing cellulitis - X-ray imaging unremarkable.  However does appear to be edematous and slightly erythematous.  Patient denies any injury or fall.  CT of the wrist and hand noting only arthritic changes.  No leukocytosis or fever either.  Erythema and edema appear worse again this morning.  Differentials include cellulitis versus underlying inflammatory arthritis.  Uric acid normal.  Initiated empiric cefazolin 11/28 with ice, elevation, Voltaren gel.  Will give p.o. colchicine 1.2 mg once followed by 0.6 mg an hour later (allergy to NSAIDs).  Will order MRI of the wrist.  Appears to be the main source of patient's pain the last 2 days.   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  Does not appear to be acute.  However may be contributing to underlying pain or weakness.  PT following.   Physical  debilitation and muscle weakness - Exacerbated by pain from above.  Opioid therapy on board.  PT following.   Subjective: Patient resting comfortably this morning, family at bedside.  Admits to no real improvement in right wrist pain, redness, swelling.  Does admit his right hip pain feels mildly improved with opioid therapy.  Denies any shortness of breath, fever, chills, chest pain, nausea, vomiting, abdominal pain.  Physical Exam:  Vitals:   03/23/24 1944 03/23/24 2152 03/24/24 0503 03/24/24 1023  BP: (!) 172/101 (!) 149/89 (!) 160/84 (!) 152/75  Pulse: 98 97 86 98  Resp: 16  16 15   Temp: 98.4 F (36.9 C)  99.7 F (37.6 C) 98.2 F (36.8 C)  TempSrc: Oral  Oral Oral  SpO2: 97% 94% 97% 98%  Weight:      Height:        GENERAL:  Alert, pleasant, mild acute distress  HEENT:  EOMI CARDIOVASCULAR:  RRR, no murmurs appreciated RESPIRATORY:  Clear to auscultation, no wheezing, rales, or rhonchi GASTROINTESTINAL:  Soft, nontender, nondistended EXTREMITIES: Right wrist with slightly worsening edema and erythema, ROM limited by pain NEURO:  No new focal deficits appreciated SKIN:  No rashes noted PSYCH:  Appropriate mood and affect    Data Reviewed:  Imaging Studies: CT HAND RIGHT W WO CONTRAST Result Date: 03/23/2024 EXAM: CT RIGHT HAND, WITH AND WITHOUT IV CONTRAST TECHNIQUE: Axial images were acquired through the right hand without and with IV contrast. 75 mL of Omnipaque  300 was administered intravenously. Reformatted images were reviewed. Automated exposure control, iterative reconstruction, and/or weight based adjustment of the mA/kV was utilized  to reduce the radiation dose to as low as reasonably achievable. COMPARISON: None available. CLINICAL HISTORY: Hand pain, chronic, inflammatory arthritis suspected, xray done. FINDINGS: BONES AND JOINTS: No acute fracture or focal osseous lesion. No dislocation. Osteoarthritis observed with mild spurring and interarticular space  narrowing of the interphalangeal joints with more striking degenerative spurring of the interphalangeal joint of the thumb. Mild spurring of the 1st metatarsal head and minimal spurring at the 1st carpometacarpal articulation. Radiocarpal osteoarthritis noted with particular loss of articular space between the distal radial articular surface and the lunate with associated degenerative subcortical cyst formation and subcortical sclerosis along the articulation between the distal radius and lunate as on image 27 series 7. Small degenerative subcortical cyst in the distal pole of the scaphoid. Mild spurring at the articulation of the scaphoid and the trapezium. Degenerative subcortical cyst in the head of the 3rd metacarpal. No well defined erosions. Dorsal spurring of the distal radial articular margin on image 46 series 8 with a chronically fragmented spur of the anterior radial lip on the same image. SOFT TISSUES: The soft tissues are unremarkable. IMPRESSION: 1. Osteoarthritis involving the interphalangeal joints (including the thumb interphalangeal joint), radiocarpal joint with distal radiuslunate joint space loss and associated subcortical cysts and sclerosis, scaphoid-trapezium articulation, and mild spurring at the 1st carpometacarpal articulation, consistent with degenerative arthropathy. Electronically signed by: Ryan Salvage MD 03/23/2024 10:42 AM EST RP Workstation: [id number]   CT CHEST ABDOMEN PELVIS W CONTRAST Result Date: 03/21/2024 EXAM: CT CHEST, ABDOMEN AND PELVIS WITH CONTRAST 03/21/2024 03:11:07 AM TECHNIQUE: CT of the chest, abdomen and pelvis was performed with the administration of 100 mL of iohexol  (OMNIPAQUE ) 300 MG/ML solution intravenous contrast. Multiplanar reformatted images are provided for review. Automated exposure control, iterative reconstruction, and/or weight based adjustment of the mA/kV was utilized to reduce the radiation dose to as low as reasonably achievable.  COMPARISON: Plain film and MRI from earlier in the same day. CLINICAL HISTORY: Lytic lesion within the pelvis, evaluate for additional lytic lesions . FINDINGS: CHEST: MEDIASTINUM AND LYMPH NODES: Heart is within normal limits. No coronary calcifications are noted. Pericardium is unremarkable. The central airways are clear. No mediastinal, hilar or axillary lymphadenopathy. The thoracic aorta is within normal limits. The pulmonary artery is visualized and is within normal limits. The esophagus as visualized is within normal limits. Thoracic inlet is within normal limits. LUNGS AND PLEURA: Lungs are well aerated bilaterally. Patchy atelectatic changes are noted in bases bilaterally. No sizable parenchymal nodule is noted. No pleural effusion or pneumothorax. ABDOMEN AND PELVIS: LIVER: Liver is within normal limits. GALLBLADDER AND BILE DUCTS: Gallbladder is within normal limits. No biliary ductal dilatation. SPLEEN: Spleen is unremarkable. PANCREAS: Pancreas is unremarkable. ADRENAL GLANDS: The adrenal glands are well visualized and within normal limits. KIDNEYS, URETERS AND BLADDER: Kidneys are well visualized and within normal limits with the exception of a tiny cyst within the left kidney. No follow-up is recommended. No stones in the kidneys or ureters. The bladder is partially distended. GI AND BOWEL: Stomach and small bowel are within normal limits. No obstructive or inflammatory changes of the colon are noted. The appendix is within normal limits. There is no bowel obstruction. REPRODUCTIVE ORGANS: No acute abnormality. A small cyst is noted centrally within the seminal vesicle. PERITONEUM AND RETROPERITONEUM: No ascites. No free air. VASCULATURE: Aorta is normal in caliber. ABDOMINAL AND PELVIS LYMPH NODES: No lymphadenopathy. BONES AND SOFT TISSUES: Bony structures show a lytic lesion within the right acetabulum superiorly similar to  that seen on recent MRI. Lytic changes are noted along the inferior aspect  of the L5 vertebral body again, likely representing a schmorl's node. No other lytic lesions are seen. No focal soft tissue abnormality. IMPRESSION: 1. Lytic lesion within the right acetabulum superiorly, similar to recent MRI, with no additional lytic lesions identified. Electronically signed by: Oneil Devonshire MD 03/21/2024 03:28 AM EST RP Workstation: [id number]   MR PELVIS W WO CONTRAST Result Date: 03/20/2024 EXAM: MRI OF THE PELVIS WITH AND WITHOUT INTRAVENOUS CONTRAST 03/20/2024 05:09:16 PM TECHNIQUE: Multiplanar magnetic resonance images of the pelvis with and without intravenous contrast. COMPARISON: None available. CLINICAL HISTORY: Aggressive right acetabular lesion on radiography, right hip pain. FINDINGS: SOFT TISSUES: Low level adjacent edema tracking along the deep margin of the iliopsoas. Low level edema posteriorly in the left greater trochanter likely due to adjacent gluteus medius tendinopathy. JOINTS: No dislocation or significant effusion. LIMITED INTRAPELVIC CONTENTS: Utricle cyst or mullerian remnant along the posterior superior prostate gland. BONES: MRI confirms an aggressive lytic expansile 3.7 x 2.7 x 3.2 cm mass in the right upper acetabulum with surrounding marrow edema and enhancement. There is cortical demineralization/thinning associated with this lesion. Enhancement along the inferior endplate of L5, please see the dedicated lumbar spine MRI report for further characterization. 0.8 cm mildly enhancing lesion in the ischial tuberosity on image 43 series 8, probably a hemangioma given the T1 signal characteristics, although technically nonspecific due to small size. Metal artifact from intramedullary nail in the right femur. IMPRESSION: 1. Aggressive lytic expansile 3.7 x 2.7 x 3.2 cm mass in the right upper acetabulum with surrounding marrow edema, enhancement, and cortical demineralization/thinning. Lytic metastatic disease or myeloma are the top differential diagnostic  considerations, correlate with serologic indicators in assessing for myeloma, and consider further technical imaging in the context of oncology workup. 2. 0.8 cm mildly enhancing lesion in the right ischial tuberosity, probably a hemangioma but technically nonspecific due to small size. Recommend attention on follow-up imaging. 3. Low level edema posteriorly in the left greater trochanter, likely due to adjacent gluteus medius tendinopathy. Electronically signed by: Ryan Salvage MD 03/20/2024 05:37 PM EST RP Workstation: [id number]   MR LUMBAR SPINE WO CONTRAST Result Date: 03/20/2024 EXAM: MRI LUMBAR SPINE 03/20/2024 05:09:16 PM TECHNIQUE: Multiplanar multisequence MRI of the lumbar spine was performed without the administration of intravenous contrast. COMPARISON: None available. CLINICAL HISTORY: Lumbar radiculopathy with right thigh pain. FINDINGS: BONES AND ALIGNMENT: Normal alignment. Normal vertebral body heights. Congenitally short pedicles in the lumbar spine. Focal inferior endplate irregularity with surrounding marrow edema along the inferior endplate of L5, most resembling an acute Schmorl node; however, given findings in the right acetabulum, the possibility of a metastatic lesion along the inferior endplate of L5 is not totally ruled out. Adjacent type 1 and type 2 degenerative endplate findings noted. Background marrow signal is unremarkable unless otherwise stated. SPINAL CORD: Normal appearing conus medullaris terminates at L1-L2. SOFT TISSUES: No paraspinal mass. LIMITATIONS/ARTIFACTS: Motion artifact is present, reducing diagnostic sensitivity and specificity. DISC DESICCATION: Disc desiccation at L3-L4, L4-L5, and L1-L2. T12-L1: Unremarkable. L1-L2: Disc bulge. No impingement. L2-L3: Disc bulge and mild facet arthropathy. No impingement. L3-L4: Moderate central stenosis due to disc bulge and short pedicles. L4-L5: Moderate central stenosis with moderate right subarticular lateral recess  stenosis and moderate right foraminal stenosis due to disc bulge, right lateral recess and foraminal disc protrusion, facet arthropathy, and short pedicles. L5-S1: Borderline bilateral foraminal stenosis due to short pedicles, disc osteophyte complex, and facet  arthropathy. IMPRESSION: 1. Moderate central stenosis with moderate right subarticular lateral recess stenosis and moderate right foraminal stenosis at L4-5 due to disc bulge, right lateral recess and foraminal disc protrusion, facet arthropathy, and short pedicles. 2. Moderate central stenosis at L3-4 due to disc bulge and short pedicles. 3. Focal inferior endplate irregularity with surrounding marrow edema along the inferior endplate of L5, most resembling an acute Schmorl's node; metastatic lesion cannot be completely excluded. Electronically signed by: Ryan Salvage MD 03/20/2024 05:28 PM EST RP Workstation: [id number]   DG Femur Min 2 Views Right Result Date: 03/20/2024 EXAM: 2 VIEW(S) XRAY OF THE FEMUR 03/20/2024 01:05:00 PM COMPARISON: 03/29/2010 CLINICAL HISTORY: hip pain/ h/o femur fracture with hardware FINDINGS: BONES AND JOINTS: Intramedullary rod and screws in place traversing healed fracture deformity of mid femoral diaphysis, with single proximal and 2 distal interlocking screws. Demineralization of the upper medial acetabulum, underlying lytic lesion not excluded. This is further discussed in the hip/pelvis radiograph report. No joint dislocation. SOFT TISSUES: The soft tissues are unremarkable. IMPRESSION: 1. Demineralization of the upper medial right acetabulum with possible underlying lytic lesion; refer to today's hip/pelvis radiograph report for further discussion. 2. Intramedullary nail traversing healed mid femoral diaphyseal fracture, unchanged hardware alignment. Electronically signed by: Ryan Salvage MD 03/20/2024 02:46 PM EST RP Workstation: [id number]   DG Hip Unilat With Pelvis 2-3 Views Right Result Date:  03/20/2024 EXAM: 2 or more VIEW(S) XRAY OF THE HIP 03/20/2024 01:05:00 PM COMPARISON: 03/29/2010 CLINICAL HISTORY: hip pain/ h/o femur fracture with hardware FINDINGS: BONES AND JOINTS: Retrograde intramedullary nail in the right femur. Abnormal 2.1 cm lucency in the right upper medial acetabulum with demineralization of the cortical margin along the acetabulum concerning for a possible lytic lesion. Lytic osseous metastatic disease or myeloma not excluded. Dedicated MRI of the bony pelvis is recommended for more complete assessment. Mild degenerative chondral thinning in both hips. The hip joint is maintained. SOFT TISSUES: The soft tissues are unremarkable. IMPRESSION: 1. Abnormal 2.1 cm lucency in the right upper medial acetabulum with cortical demineralization, concerning for a lytic lesion; lytic osseous metastatic disease or myeloma not excluded, recommend dedicated MRI of the bony pelvis for further assessment. 2. Mild degenerative chondral thinning in both hips. Electronically signed by: Ryan Salvage MD 03/20/2024 02:43 PM EST RP Workstation: [id number]   DG Wrist Complete Right Result Date: 03/20/2024 EXAM: 3 OR MORE VIEW(S) XRAY OF THE WRIST 03/20/2024 12:16:00 PM COMPARISON: None available. CLINICAL HISTORY: pain swelling FINDINGS: BONES AND JOINTS: No acute fracture. No focal osseous lesion. No joint dislocation. SOFT TISSUES: Mild soft tissue swelling. IMPRESSION: 1. Mild soft tissue swelling. 2. No acute fracture or dislocation. Electronically signed by: Donnice Mania MD 03/20/2024 12:49 PM EST RP Workstation: [id number]    Results are pending, will review when available.  Previous records (including but not limited to H&P, progress notes, nursing notes, TOC management) were reviewed in assessment of this patient.  Labs: CBC: Recent Labs  Lab 03/20/24 1235 03/20/24 1900 03/22/24 0448 03/24/24 0417  WBC 9.8 12.3* 12.1* 8.7  NEUTROABS 7.8* 9.9*  --   --   HGB 15.6 15.0 14.3 14.9   HCT 45.3 43.3 41.9 44.8  MCV 97.2 96.2 96.5 97.2  PLT 201 196 197 203   Basic Metabolic Panel: Recent Labs  Lab 03/20/24 1235 03/22/24 0448 03/24/24 0417  NA 137 135 137  K 4.0 4.3 4.6  CL 98 99 99  CO2 26 24 28   GLUCOSE 108* 133* 150*  BUN 17  17 16  CREATININE 1.31* 1.02 1.04  CALCIUM  9.8 9.2 9.0   Liver Function Tests: Recent Labs  Lab 03/22/24 1442 03/24/24 0417  AST 19 30  ALT 8 26  ALKPHOS 92 65  BILITOT 0.6 0.5  PROT 7.8 7.7  ALBUMIN 3.7 3.5   CBG: No results for input(s): GLUCAP in the last 168 hours.  Scheduled Meds:  atorvastatin   40 mg Oral Daily   colchicine  1.2 mg Oral Once   Followed by   colchicine  0.6 mg Oral Once   diclofenac Sodium  2 g Topical QID   enoxaparin  (LOVENOX ) injection  40 mg Subcutaneous Q24H   losartan   50 mg Oral Daily   Continuous Infusions:   ceFAZolin (ANCEF) IV 2 g (03/24/24 0503)   PRN Meds:.acetaminophen , HYDROmorphone  (DILAUDID ) injection, oxyCODONE   Family Communication: At bedside  Disposition: Status is: Inpatient Remains inpatient appropriate because: Lytic lesion, right wrist pain     Time spent: 51 minutes  Length of inpatient stay: 3 days  Author: Carliss LELON Canales, DO 03/24/2024 10:43 AM  For on call review [email or web address].

## 2024-03-24 NOTE — Progress Notes (Addendum)
 Patient refused antibiotics to be administered. Patient stated, the doctor told me that was finished and wasn't what they thought it was, I dont want my kidneys to work that hard. Education provided to patient. Patient family member at bedside. MD on call notified. Plan of care ongoing.

## 2024-03-24 NOTE — Progress Notes (Signed)
 Orthopedic Tech Progress Note Patient Details:  Aaron Willis August 03, 1960 [id number]  Ortho Devices Type of Ortho Device: Shoulder immobilizer Ortho Device/Splint Location: RUE Ortho Device/Splint Interventions: Ordered, Application, Adjustment   Post Interventions Patient Tolerated: Well Instructions Provided: Care of device, Adjustment of device  Aaron Willis 03/24/2024, 11:14 AM

## 2024-03-25 ENCOUNTER — Encounter (HOSPITAL_COMMUNITY): Payer: Self-pay | Admitting: Internal Medicine

## 2024-03-25 DIAGNOSIS — L03113 Cellulitis of right upper limb: Secondary | ICD-10-CM

## 2024-03-25 LAB — CBC
HCT: 42.3 % (ref 39.0–52.0)
Hemoglobin: 14.2 g/dL (ref 13.0–17.0)
MCH: 33 pg (ref 26.0–34.0)
MCHC: 33.6 g/dL (ref 30.0–36.0)
MCV: 98.4 fL (ref 80.0–100.0)
Platelets: 214 K/uL (ref 150–400)
RBC: 4.3 MIL/uL (ref 4.22–5.81)
RDW: 14.8 % (ref 11.5–15.5)
WBC: 8.5 K/uL (ref 4.0–10.5)
nRBC: 0 % (ref 0.0–0.2)

## 2024-03-25 LAB — COMPREHENSIVE METABOLIC PANEL WITH GFR
ALT: 50 U/L — ABNORMAL HIGH (ref 0–44)
AST: 59 U/L — ABNORMAL HIGH (ref 15–41)
Albumin: 3.2 g/dL — ABNORMAL LOW (ref 3.5–5.0)
Alkaline Phosphatase: 64 U/L (ref 38–126)
Anion gap: 10 (ref 5–15)
BUN: 15 mg/dL (ref 8–23)
CO2: 28 mmol/L (ref 22–32)
Calcium: 9.1 mg/dL (ref 8.9–10.3)
Chloride: 99 mmol/L (ref 98–111)
Creatinine, Ser: 1.12 mg/dL (ref 0.61–1.24)
GFR, Estimated: >60 mL/min (ref >60)
Glucose, Bld: 141 mg/dL — ABNORMAL HIGH (ref 70–99)
Potassium: 4.5 mmol/L (ref 3.5–5.1)
Sodium: 137 mmol/L (ref 135–145)
Total Bilirubin: 0.5 mg/dL (ref 0.0–1.2)
Total Protein: 7.4 g/dL (ref 6.5–8.1)

## 2024-03-25 MED ORDER — COLCHICINE 0.6 MG PO TABS
0.6000 mg | ORAL_TABLET | Freq: Every day | ORAL | Status: DC
  Administered 2024-03-25 – 2024-03-30 (×6): 0.6 mg via ORAL
  Filled 2024-03-25 (×6): qty 1

## 2024-03-25 MED ORDER — SODIUM CHLORIDE 0.9 % IV SOLN: INTRAVENOUS | Status: AC | PRN

## 2024-03-25 NOTE — Plan of Care (Signed)
   Problem: Clinical Measurements: Goal: Diagnostic test results will improve Outcome: Progressing

## 2024-03-25 NOTE — Progress Notes (Signed)
 Patient agreed to have antibiotic administered this am.

## 2024-03-25 NOTE — Progress Notes (Signed)
 Progress Note   Patient: Aaron Willis FMW:[id number] DOB: 04-Apr-1961 DOA: 03/20/2024  DOS: the patient was seen and examined on 03/25/2024   Brief hospital course:  63 y.o. male with medical history significant of hypertension, use who presented to the emergency department with right hip pain and arm pain.    Assessment and Plan:   Lytic lesions in right acetabulum/right hip pain - MRI pelvis noting aggressive lytic expansile 3.7 x 2.7 x 3.2 cm mass in the right upper acetabulum with surrounding marrow edema, enhancement, and cortical demineralization/thinning.  Concern for lytic multiple myeloma or metastatic disease.  CT chest abdomen pelvis unrevealing.  No hypercalcemia, anemia, renal dysfunction to suggest MM.  Oncology consulted and following closely.  Pain control improved with as needed Dilaudid , oxycodone  10 mg every 6 hours as needed.  Patient has allergy to NSAIDs, caused hypertensive emergency/urgency necessitating ER visit.  Ortho and interventional radiology consulted.  IR to attempt a biopsy tomorrow 12/1.   Right wrist pain-concern for developing cellulitis - Right wrist noted to be edematous and slightly erythematous.  Patient denies any injury or fall.  X-ray imaging unremarkable.  CT of the wrist and hand noting only arthritic changes.  No leukocytosis or fever either.  Differentials include cellulitis versus underlying inflammatory arthritis.  Uric acid normal.  Initiated empiric cefazolin 11/28 with ice, elevation, Voltaren gel.  Initiated p.o. colchicine (NSAID allergy) on 11/29.  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  Appears to be the main source of patient's pain the last 2 days.  Showing mild improvement this morning.   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  Does not appear to be acute.  However may be contributing to underlying  pain or weakness.  PT following.   Physical debilitation and muscle weakness - Exacerbated by pain from above.  Opioid therapy on board.  PT following.  Likely transitioning to short-term rehab.     Subjective: Patient show improvement this morning.  Still having right wrist pain but states it is a little bit improved from yesterday.  Right hip pain well-controlled.  Was able to work with physical therapy.  Denies any fever, chills, chest pain, shortness of breath, nausea, vomiting, abdominal pain.  Physical Exam:  Vitals:   03/24/24 1500 03/24/24 1526 03/24/24 2143 03/25/24 0609  BP: (!) 171/107 (!) 141/91 (!) 169/84 (!) 151/75  Pulse: 67 97 62 86  Resp: 16  16 16   Temp: 98.8 F (37.1 C)  98.7 F (37.1 C) 98.7 F (37.1 C)  TempSrc: Oral     SpO2: 100% 98% 97% 97%  Weight:      Height:        GENERAL:  Alert, pleasant  HEENT:  EOMI CARDIOVASCULAR:  RRR, no murmurs appreciated RESPIRATORY:  Clear to auscultation, no wheezing, rales, or rhonchi GASTROINTESTINAL:  Soft, nontender, nondistended EXTREMITIES: Right wrist with mild edema and erythema, ROM limited by pain NEURO:  No new focal deficits appreciated SKIN:  No rashes noted PSYCH:  Appropriate mood and affect    Data Reviewed:  Imaging Studies: MR WRIST RIGHT W WO CONTRAST Result Date: 03/25/2024 CLINICAL DATA:  Wrist pain, chronic, inflammatory arthritis suspected, xray done EXAM: MR OF THE RIGHT WRIST WITHOUT AND WITH CONTRAST TECHNIQUE: Multiplanar multisequence MR imaging of the right wrist was performed both before and after the administration of intravenous contrast. CONTRAST:  10mL GADAVIST  GADOBUTROL  1  MMOL/ML IV SOLN COMPARISON:  Right wrist radiographs 03/20/2024. CT of the right hand 03/22/2024. FINDINGS: Ligaments: The scapholunate and lunotriquetral ligaments appear intact. Triangular fibrocartilage: Probable small central degenerative tear of the triangular fibrocartilage, best seen on the coronal images.  The ulnar variance is neutral. Tendons: The flexor and extensor tendons appear intact. There is a small to moderate amount of extensor tendon sheath fluid and synovial enhancement following contrast. There is also mild synovial enhancement within the carpal tunnel. Carpal tunnel/median nerve: Unremarkable. Guyon's canal: Unremarkable. Joint/cartilage: As seen on radiographs and CT, there are moderate radiocarpal and intercarpal degenerative changes with scattered subchondral cysts and osteophytes. Degenerative changes are also present at the 1st carpometacarpal and visualized metacarpal phalangeal joints. Moderate amount of fluid within the midcarpal, radiocarpal and distal radioulnar compartments with associated mild synovial enhancement following contrast. Bones/carpal alignment: The carpal bone alignment is normal.No evidence of acute fracture, dislocation or osteomyelitis. Other: Moderate generalized subcutaneous edema throughout the dorsal soft tissues of the wrist and hand. Mild associated subcutaneous enhancement, suggesting possible cellulitis. No organized fluid collection, foreign body or soft tissue emphysema identified. IMPRESSION: 1. Moderate generalized subcutaneous edema throughout the dorsal soft tissues of the wrist and hand with mild associated subcutaneous enhancement, suggesting possible cellulitis. No organized fluid collection, foreign body or soft tissue emphysema identified. 2. Moderate radiocarpal and intercarpal degenerative changes with joint effusions, scattered subchondral cysts and osteophytes. Findings could be secondary to osteoarthritis, inflammatory or crystalline arthropathy. No evidence of osteomyelitis. 3. Mild extensor and flexor tenosynovitis. 4. Probable small central degenerative tear of the triangular fibrocartilage. Electronically Signed   By: Elsie Perone M.D.   On: 03/25/2024 09:52   CT HAND RIGHT W WO CONTRAST Result Date: 03/23/2024 EXAM: CT RIGHT HAND, WITH AND  WITHOUT IV CONTRAST TECHNIQUE: Axial images were acquired through the right hand without and with IV contrast. 75 mL of Omnipaque  300 was administered intravenously. Reformatted images were reviewed. Automated exposure control, iterative reconstruction, and/or weight based adjustment of the mA/kV was utilized to reduce the radiation dose to as low as reasonably achievable. COMPARISON: None available. CLINICAL HISTORY: Hand pain, chronic, inflammatory arthritis suspected, xray done. FINDINGS: BONES AND JOINTS: No acute fracture or focal osseous lesion. No dislocation. Osteoarthritis observed with mild spurring and interarticular space narrowing of the interphalangeal joints with more striking degenerative spurring of the interphalangeal joint of the thumb. Mild spurring of the 1st metatarsal head and minimal spurring at the 1st carpometacarpal articulation. Radiocarpal osteoarthritis noted with particular loss of articular space between the distal radial articular surface and the lunate with associated degenerative subcortical cyst formation and subcortical sclerosis along the articulation between the distal radius and lunate as on image 27 series 7. Small degenerative subcortical cyst in the distal pole of the scaphoid. Mild spurring at the articulation of the scaphoid and the trapezium. Degenerative subcortical cyst in the head of the 3rd metacarpal. No well defined erosions. Dorsal spurring of the distal radial articular margin on image 46 series 8 with a chronically fragmented spur of the anterior radial lip on the same image. SOFT TISSUES: The soft tissues are unremarkable. IMPRESSION: 1. Osteoarthritis involving the interphalangeal joints (including the thumb interphalangeal joint), radiocarpal joint with distal radiuslunate joint space loss and associated subcortical cysts and sclerosis, scaphoid-trapezium articulation, and mild spurring at the 1st carpometacarpal articulation, consistent with degenerative  arthropathy. Electronically signed by: Ryan Salvage MD 03/23/2024 10:42 AM EST RP Workstation: [id number]   CT CHEST ABDOMEN PELVIS W CONTRAST Result Date: 03/21/2024  EXAM: CT CHEST, ABDOMEN AND PELVIS WITH CONTRAST 03/21/2024 03:11:07 AM TECHNIQUE: CT of the chest, abdomen and pelvis was performed with the administration of 100 mL of iohexol  (OMNIPAQUE ) 300 MG/ML solution intravenous contrast. Multiplanar reformatted images are provided for review. Automated exposure control, iterative reconstruction, and/or weight based adjustment of the mA/kV was utilized to reduce the radiation dose to as low as reasonably achievable. COMPARISON: Plain film and MRI from earlier in the same day. CLINICAL HISTORY: Lytic lesion within the pelvis, evaluate for additional lytic lesions . FINDINGS: CHEST: MEDIASTINUM AND LYMPH NODES: Heart is within normal limits. No coronary calcifications are noted. Pericardium is unremarkable. The central airways are clear. No mediastinal, hilar or axillary lymphadenopathy. The thoracic aorta is within normal limits. The pulmonary artery is visualized and is within normal limits. The esophagus as visualized is within normal limits. Thoracic inlet is within normal limits. LUNGS AND PLEURA: Lungs are well aerated bilaterally. Patchy atelectatic changes are noted in bases bilaterally. No sizable parenchymal nodule is noted. No pleural effusion or pneumothorax. ABDOMEN AND PELVIS: LIVER: Liver is within normal limits. GALLBLADDER AND BILE DUCTS: Gallbladder is within normal limits. No biliary ductal dilatation. SPLEEN: Spleen is unremarkable. PANCREAS: Pancreas is unremarkable. ADRENAL GLANDS: The adrenal glands are well visualized and within normal limits. KIDNEYS, URETERS AND BLADDER: Kidneys are well visualized and within normal limits with the exception of a tiny cyst within the left kidney. No follow-up is recommended. No stones in the kidneys or ureters. The bladder is partially  distended. GI AND BOWEL: Stomach and small bowel are within normal limits. No obstructive or inflammatory changes of the colon are noted. The appendix is within normal limits. There is no bowel obstruction. REPRODUCTIVE ORGANS: No acute abnormality. A small cyst is noted centrally within the seminal vesicle. PERITONEUM AND RETROPERITONEUM: No ascites. No free air. VASCULATURE: Aorta is normal in caliber. ABDOMINAL AND PELVIS LYMPH NODES: No lymphadenopathy. BONES AND SOFT TISSUES: Bony structures show a lytic lesion within the right acetabulum superiorly similar to that seen on recent MRI. Lytic changes are noted along the inferior aspect of the L5 vertebral body again, likely representing a schmorl's node. No other lytic lesions are seen. No focal soft tissue abnormality. IMPRESSION: 1. Lytic lesion within the right acetabulum superiorly, similar to recent MRI, with no additional lytic lesions identified. Electronically signed by: Oneil Devonshire MD 03/21/2024 03:28 AM EST RP Workstation: [id number]   MR PELVIS W WO CONTRAST Result Date: 03/20/2024 EXAM: MRI OF THE PELVIS WITH AND WITHOUT INTRAVENOUS CONTRAST 03/20/2024 05:09:16 PM TECHNIQUE: Multiplanar magnetic resonance images of the pelvis with and without intravenous contrast. COMPARISON: None available. CLINICAL HISTORY: Aggressive right acetabular lesion on radiography, right hip pain. FINDINGS: SOFT TISSUES: Low level adjacent edema tracking along the deep margin of the iliopsoas. Low level edema posteriorly in the left greater trochanter likely due to adjacent gluteus medius tendinopathy. JOINTS: No dislocation or significant effusion. LIMITED INTRAPELVIC CONTENTS: Utricle cyst or mullerian remnant along the posterior superior prostate gland. BONES: MRI confirms an aggressive lytic expansile 3.7 x 2.7 x 3.2 cm mass in the right upper acetabulum with surrounding marrow edema and enhancement. There is cortical demineralization/thinning associated with this  lesion. Enhancement along the inferior endplate of L5, please see the dedicated lumbar spine MRI report for further characterization. 0.8 cm mildly enhancing lesion in the ischial tuberosity on image 43 series 8, probably a hemangioma given the T1 signal characteristics, although technically nonspecific due to small size. Metal artifact from intramedullary nail  in the right femur. IMPRESSION: 1. Aggressive lytic expansile 3.7 x 2.7 x 3.2 cm mass in the right upper acetabulum with surrounding marrow edema, enhancement, and cortical demineralization/thinning. Lytic metastatic disease or myeloma are the top differential diagnostic considerations, correlate with serologic indicators in assessing for myeloma, and consider further technical imaging in the context of oncology workup. 2. 0.8 cm mildly enhancing lesion in the right ischial tuberosity, probably a hemangioma but technically nonspecific due to small size. Recommend attention on follow-up imaging. 3. Low level edema posteriorly in the left greater trochanter, likely due to adjacent gluteus medius tendinopathy. Electronically signed by: Ryan Salvage MD 03/20/2024 05:37 PM EST RP Workstation: [id number]   MR LUMBAR SPINE WO CONTRAST Result Date: 03/20/2024 EXAM: MRI LUMBAR SPINE 03/20/2024 05:09:16 PM TECHNIQUE: Multiplanar multisequence MRI of the lumbar spine was performed without the administration of intravenous contrast. COMPARISON: None available. CLINICAL HISTORY: Lumbar radiculopathy with right thigh pain. FINDINGS: BONES AND ALIGNMENT: Normal alignment. Normal vertebral body heights. Congenitally short pedicles in the lumbar spine. Focal inferior endplate irregularity with surrounding marrow edema along the inferior endplate of L5, most resembling an acute Schmorl node; however, given findings in the right acetabulum, the possibility of a metastatic lesion along the inferior endplate of L5 is not totally ruled out. Adjacent type 1 and type 2  degenerative endplate findings noted. Background marrow signal is unremarkable unless otherwise stated. SPINAL CORD: Normal appearing conus medullaris terminates at L1-L2. SOFT TISSUES: No paraspinal mass. LIMITATIONS/ARTIFACTS: Motion artifact is present, reducing diagnostic sensitivity and specificity. DISC DESICCATION: Disc desiccation at L3-L4, L4-L5, and L1-L2. T12-L1: Unremarkable. L1-L2: Disc bulge. No impingement. L2-L3: Disc bulge and mild facet arthropathy. No impingement. L3-L4: Moderate central stenosis due to disc bulge and short pedicles. L4-L5: Moderate central stenosis with moderate right subarticular lateral recess stenosis and moderate right foraminal stenosis due to disc bulge, right lateral recess and foraminal disc protrusion, facet arthropathy, and short pedicles. L5-S1: Borderline bilateral foraminal stenosis due to short pedicles, disc osteophyte complex, and facet arthropathy. IMPRESSION: 1. Moderate central stenosis with moderate right subarticular lateral recess stenosis and moderate right foraminal stenosis at L4-5 due to disc bulge, right lateral recess and foraminal disc protrusion, facet arthropathy, and short pedicles. 2. Moderate central stenosis at L3-4 due to disc bulge and short pedicles. 3. Focal inferior endplate irregularity with surrounding marrow edema along the inferior endplate of L5, most resembling an acute Schmorl's node; metastatic lesion cannot be completely excluded. Electronically signed by: Ryan Salvage MD 03/20/2024 05:28 PM EST RP Workstation: [id number]   DG Femur Min 2 Views Right Result Date: 03/20/2024 EXAM: 2 VIEW(S) XRAY OF THE FEMUR 03/20/2024 01:05:00 PM COMPARISON: 03/29/2010 CLINICAL HISTORY: hip pain/ h/o femur fracture with hardware FINDINGS: BONES AND JOINTS: Intramedullary rod and screws in place traversing healed fracture deformity of mid femoral diaphysis, with single proximal and 2 distal interlocking screws. Demineralization of the upper  medial acetabulum, underlying lytic lesion not excluded. This is further discussed in the hip/pelvis radiograph report. No joint dislocation. SOFT TISSUES: The soft tissues are unremarkable. IMPRESSION: 1. Demineralization of the upper medial right acetabulum with possible underlying lytic lesion; refer to today's hip/pelvis radiograph report for further discussion. 2. Intramedullary nail traversing healed mid femoral diaphyseal fracture, unchanged hardware alignment. Electronically signed by: Ryan Salvage MD 03/20/2024 02:46 PM EST RP Workstation: [id number]   DG Hip Unilat With Pelvis 2-3 Views Right Result Date: 03/20/2024 EXAM: 2 or more VIEW(S) XRAY OF THE HIP 03/20/2024 01:05:00 PM COMPARISON: 03/29/2010 CLINICAL HISTORY:  hip pain/ h/o femur fracture with hardware FINDINGS: BONES AND JOINTS: Retrograde intramedullary nail in the right femur. Abnormal 2.1 cm lucency in the right upper medial acetabulum with demineralization of the cortical margin along the acetabulum concerning for a possible lytic lesion. Lytic osseous metastatic disease or myeloma not excluded. Dedicated MRI of the bony pelvis is recommended for more complete assessment. Mild degenerative chondral thinning in both hips. The hip joint is maintained. SOFT TISSUES: The soft tissues are unremarkable. IMPRESSION: 1. Abnormal 2.1 cm lucency in the right upper medial acetabulum with cortical demineralization, concerning for a lytic lesion; lytic osseous metastatic disease or myeloma not excluded, recommend dedicated MRI of the bony pelvis for further assessment. 2. Mild degenerative chondral thinning in both hips. Electronically signed by: Ryan Salvage MD 03/20/2024 02:43 PM EST RP Workstation: [id number]   DG Wrist Complete Right Result Date: 03/20/2024 EXAM: 3 OR MORE VIEW(S) XRAY OF THE WRIST 03/20/2024 12:16:00 PM COMPARISON: None available. CLINICAL HISTORY: pain swelling FINDINGS: BONES AND JOINTS: No acute fracture. No focal  osseous lesion. No joint dislocation. SOFT TISSUES: Mild soft tissue swelling. IMPRESSION: 1. Mild soft tissue swelling. 2. No acute fracture or dislocation. Electronically signed by: Donnice Mania MD 03/20/2024 12:49 PM EST RP Workstation: [id number]    There are no new results to review at this time.  Previous records (including but not limited to H&P, progress notes, nursing notes, TOC management) were reviewed in assessment of this patient.  Labs: CBC: Recent Labs  Lab 03/20/24 1235 03/20/24 1900 03/22/24 0448 03/24/24 0417 03/25/24 0421  WBC 9.8 12.3* 12.1* 8.7 8.5  NEUTROABS 7.8* 9.9*  --   --   --   HGB 15.6 15.0 14.3 14.9 14.2  HCT 45.3 43.3 41.9 44.8 42.3  MCV 97.2 96.2 96.5 97.2 98.4  PLT 201 196 197 203 214   Basic Metabolic Panel: Recent Labs  Lab 03/20/24 1235 03/22/24 0448 03/24/24 0417 03/25/24 0421  NA 137 135 137 137  K 4.0 4.3 4.6 4.5  CL 98 99 99 99  CO2 26 24 28 28   GLUCOSE 108* 133* 150* 141*  BUN 17 17 16 15   CREATININE 1.31* 1.02 1.04 1.12  CALCIUM  9.8 9.2 9.0 9.1   Liver Function Tests: Recent Labs  Lab 03/22/24 1442 03/24/24 0417 03/25/24 0421  AST 19 30 59*  ALT 8 26 50*  ALKPHOS 92 65 64  BILITOT 0.6 0.5 0.5  PROT 7.8 7.7 7.4  ALBUMIN 3.7 3.5 3.2*   CBG: No results for input(s): GLUCAP in the last 168 hours.  Scheduled Meds:  atorvastatin   40 mg Oral Daily   colchicine  0.6 mg Oral Daily   diclofenac Sodium  2 g Topical QID   enoxaparin  (LOVENOX ) injection  40 mg Subcutaneous Q24H   losartan   50 mg Oral Daily   Continuous Infusions:   ceFAZolin (ANCEF) IV 2 g (03/25/24 0520)   PRN Meds:.acetaminophen , HYDROmorphone  (DILAUDID ) injection, oxyCODONE   Family Communication: At bedside  Disposition: Status is: Inpatient Remains inpatient appropriate because: Wrist and hip pain, biopsy     Time spent: 41 minutes  Length of inpatient stay: 4 days  Author: Carliss LELON Canales, DO 03/25/2024 11:11 AM  For on call review  [email or web address].

## 2024-03-26 ENCOUNTER — Inpatient Hospital Stay (HOSPITAL_COMMUNITY)

## 2024-03-26 LAB — CULTURE, BLOOD (SINGLE): Culture: NO GROWTH

## 2024-03-26 LAB — KAPPA/LAMBDA LIGHT CHAINS
Kappa free light chain: 67.3 mg/L — ABNORMAL HIGH (ref 3.3–19.4)
Kappa, lambda light chain ratio: 9.22 — ABNORMAL HIGH (ref 0.26–1.65)
Lambda free light chains: 7.3 mg/L (ref 5.7–26.3)

## 2024-03-26 LAB — BASIC METABOLIC PANEL WITH GFR
Anion gap: 10 (ref 5–15)
BUN: 15 mg/dL (ref 8–23)
CO2: 28 mmol/L (ref 22–32)
Calcium: 9.2 mg/dL (ref 8.9–10.3)
Chloride: 98 mmol/L (ref 98–111)
Creatinine, Ser: 1.09 mg/dL (ref 0.61–1.24)
GFR, Estimated: >60 mL/min (ref >60)
Glucose, Bld: 136 mg/dL — ABNORMAL HIGH (ref 70–99)
Potassium: 4.3 mmol/L (ref 3.5–5.1)
Sodium: 136 mmol/L (ref 135–145)

## 2024-03-26 LAB — MULTIPLE MYELOMA PANEL, SERUM
Albumin SerPl Elph-Mcnc: 3 g/dL (ref 2.9–4.4)
Albumin/Glob SerPl: 0.9 (ref 0.7–1.7)
Alpha 1: 0.5 g/dL — ABNORMAL HIGH (ref 0.0–0.4)
Alpha2 Glob SerPl Elph-Mcnc: 1.3 g/dL — ABNORMAL HIGH (ref 0.4–1.0)
B-Globulin SerPl Elph-Mcnc: 0.6 g/dL — ABNORMAL LOW (ref 0.7–1.3)
Gamma Glob SerPl Elph-Mcnc: 1.3 g/dL (ref 0.4–1.8)
Globulin, Total: 3.7 g/dL (ref 2.2–3.9)
IgA: 30 mg/dL — ABNORMAL LOW (ref 61–437)
IgG (Immunoglobin G), Serum: 1807 mg/dL — ABNORMAL HIGH (ref 603–1613)
IgM (Immunoglobulin M), Srm: 36 mg/dL (ref 20–172)
M Protein SerPl Elph-Mcnc: 1.2 g/dL — ABNORMAL HIGH
Total Protein ELP: 6.7 g/dL (ref 6.0–8.5)

## 2024-03-26 LAB — CBC
HCT: 39.5 % (ref 39.0–52.0)
Hemoglobin: 13.2 g/dL (ref 13.0–17.0)
MCH: 32.5 pg (ref 26.0–34.0)
MCHC: 33.4 g/dL (ref 30.0–36.0)
MCV: 97.3 fL (ref 80.0–100.0)
Platelets: 249 K/uL (ref 150–400)
RBC: 4.06 MIL/uL — ABNORMAL LOW (ref 4.22–5.81)
RDW: 14.5 % (ref 11.5–15.5)
WBC: 9 K/uL (ref 4.0–10.5)
nRBC: 0 % (ref 0.0–0.2)

## 2024-03-26 MED ORDER — MIDAZOLAM HCL 2 MG/2ML IJ SOLN
INTRAMUSCULAR | Status: AC
  Filled 2024-03-26: qty 4

## 2024-03-26 MED ORDER — FENTANYL CITRATE (PF) 100 MCG/2ML IJ SOLN
INTRAMUSCULAR | Status: AC
  Filled 2024-03-26: qty 4

## 2024-03-26 MED ORDER — FENTANYL CITRATE (PF) 100 MCG/2ML IJ SOLN
INTRAMUSCULAR | Status: AC | PRN
  Administered 2024-03-26 (×2): 50 ug via INTRAVENOUS

## 2024-03-26 MED ORDER — MIDAZOLAM HCL (PF) 2 MG/2ML IJ SOLN
INTRAMUSCULAR | Status: AC | PRN
  Administered 2024-03-26 (×2): 1 mg via INTRAVENOUS

## 2024-03-26 NOTE — Plan of Care (Signed)
   Problem: Health Behavior/Discharge Planning: Goal: Ability to manage health-related needs will improve Outcome: Progressing

## 2024-03-26 NOTE — TOC Progression Note (Signed)
 Transition of Care [facility]) - Progression Note    Patient Details  Name: Aaron Willis MRN: [id number] Date of Birth: 11-Feb-1961  [facility] CM/SW Contact  Tawni CHRISTELLA Eva, LCSW Phone Number: 03/26/2024, 4:02 PM  Clinical Narrative:     CSW met with the pt at bedside to discuss the recommendation for SNF placement. The pt is agreeable and would like to use his [employer] benefits. CSW explained the process, and ICM will fax the pt's information to the [facility] in the morning, as the [facility] reviews referrals every morning at 11 a.m. ICM to follow.  Expected Discharge Plan: Skilled Nursing Facility Barriers to Discharge: SNF Pending bed offer, Insurance Authorization, Continued Medical Work up               Expected Discharge Plan and Services       Living arrangements for the past 2 months: Apartment                                       Social Drivers of Health (SDOH) Interventions SDOH Screenings   Food Insecurity: No Food Insecurity (03/21/2024)  Housing: Low Risk  (03/21/2024)  Transportation Needs: No Transportation Needs (03/21/2024)  Utilities: Not At Risk (03/21/2024)  Tobacco Use: Unknown (03/25/2024)    Readmission Risk Interventions    03/21/2024   10:12 AM  Readmission Risk Prevention Plan  Post Dischage Appt Complete  Medication Screening Complete  Transportation Screening Complete

## 2024-03-26 NOTE — Progress Notes (Signed)
 Progress Note   Patient: Aaron Willis FMW:[id number] DOB: 1960/09/24 DOA: 03/20/2024  DOS: the patient was seen and examined on 03/26/2024   Brief hospital course:  63 y.o. male with medical history significant of hypertension, use who presented to the emergency department with right hip pain and arm pain.    Assessment and Plan:   Lytic lesions in right acetabulum/right hip pain - MRI pelvis noting aggressive lytic expansile 3.7 x 2.7 x 3.2 cm mass in the right upper acetabulum with surrounding marrow edema, enhancement, and cortical demineralization/thinning.  Concern for lytic multiple myeloma, osteosarcoma, or metastatic disease.  CT chest abdomen pelvis unrevealing.  No hypercalcemia, anemia, renal dysfunction to suggest MM.  Oncology consulted and following closely.  Pain control improved with as needed Dilaudid , oxycodone  10 mg every 6 hours as needed.  Patient has allergy to NSAIDs, caused hypertensive emergency/urgency necessitating ER visit.  Ortho and interventional radiology consulted.  IR to attempt a biopsy later today 12/1.   Right wrist pain-concern for developing cellulitis - X-ray imaging unremarkable.  However appeared to be edematous and slightly erythematous.  Patient denies any injury or fall.  Differentials include cellulitis versus underlying inflammatory arthritis.  Uric acid normal.  CT of the wrist and hand noting only arthritic changes.  No leukocytosis or fever either.  Initiated empiric cefazolin 11/28 with ice, elevation, Voltaren gel.  Initiated p.o. colchicine (NSAID allergy) on 11/29.  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  Showing improvement on current regimen.     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  Does not appear to be acute.  However may be contributing to underlying pain or weakness.  PT following.    Physical debilitation and muscle weakness - Exacerbated by pain from above.  Opioid therapy on board.  PT following.  Currently recommending SNF.     Subjective: Patient showing improvement again this morning.  Right wrist appears less edematous and erythematous, noted wrinkles, improved range of motion wrist and fingers.  Right hip pain well-controlled. Denies any fever, chills, chest pain, shortness of breath, nausea, vomiting, abdominal pain.  Eager for biopsy later today.  Physical Exam:  Vitals:   03/25/24 0609 03/25/24 1405 03/25/24 2203 03/26/24 0554  BP: (!) 151/75 (!) 153/75 (!) 167/79 (!) 151/81  Pulse: 86 83 87 89  Resp: 16 16 17 16   Temp: 98.7 F (37.1 C) 99.4 F (37.4 C) 99.6 F (37.6 C) 99.9 F (37.7 C)  TempSrc:  Oral Oral Oral  SpO2: 97% 100% 98% 95%  Weight:      Height:        GENERAL:  Alert, pleasant  HEENT:  EOMI CARDIOVASCULAR:  RRR, no murmurs appreciated RESPIRATORY:  Clear to auscultation, no wheezing, rales, or rhonchi GASTROINTESTINAL:  Soft, nontender, nondistended EXTREMITIES: Right wrist with improving edema and erythema, ROM improved but limited by pain NEURO:  No new focal deficits appreciated SKIN:  No rashes noted PSYCH:  Appropriate mood and affect    Data Reviewed:  Imaging Studies: MR WRIST RIGHT W WO CONTRAST Result Date: 03/25/2024 CLINICAL DATA:  Wrist pain, chronic, inflammatory arthritis suspected, xray done EXAM: MR OF THE RIGHT WRIST WITHOUT AND WITH CONTRAST TECHNIQUE: Multiplanar multisequence MR imaging of the right wrist was performed both before and after the administration of intravenous contrast. CONTRAST:  10mL GADAVIST  GADOBUTROL  1 MMOL/ML IV SOLN COMPARISON:  Right wrist radiographs  03/20/2024. CT of the right hand 03/22/2024. FINDINGS: Ligaments: The scapholunate and lunotriquetral ligaments appear intact. Triangular fibrocartilage: Probable small central degenerative tear of the triangular fibrocartilage, best seen on  the coronal images. The ulnar variance is neutral. Tendons: The flexor and extensor tendons appear intact. There is a small to moderate amount of extensor tendon sheath fluid and synovial enhancement following contrast. There is also mild synovial enhancement within the carpal tunnel. Carpal tunnel/median nerve: Unremarkable. Guyon's canal: Unremarkable. Joint/cartilage: As seen on radiographs and CT, there are moderate radiocarpal and intercarpal degenerative changes with scattered subchondral cysts and osteophytes. Degenerative changes are also present at the 1st carpometacarpal and visualized metacarpal phalangeal joints. Moderate amount of fluid within the midcarpal, radiocarpal and distal radioulnar compartments with associated mild synovial enhancement following contrast. Bones/carpal alignment: The carpal bone alignment is normal.No evidence of acute fracture, dislocation or osteomyelitis. Other: Moderate generalized subcutaneous edema throughout the dorsal soft tissues of the wrist and hand. Mild associated subcutaneous enhancement, suggesting possible cellulitis. No organized fluid collection, foreign body or soft tissue emphysema identified. IMPRESSION: 1. Moderate generalized subcutaneous edema throughout the dorsal soft tissues of the wrist and hand with mild associated subcutaneous enhancement, suggesting possible cellulitis. No organized fluid collection, foreign body or soft tissue emphysema identified. 2. Moderate radiocarpal and intercarpal degenerative changes with joint effusions, scattered subchondral cysts and osteophytes. Findings could be secondary to osteoarthritis, inflammatory or crystalline arthropathy. No evidence of osteomyelitis. 3. Mild extensor and flexor tenosynovitis. 4. Probable small central degenerative tear of the triangular fibrocartilage. Electronically Signed   By: Elsie Perone M.D.   On: 03/25/2024 09:52   CT HAND RIGHT W WO CONTRAST Result Date: 03/23/2024 EXAM: CT  RIGHT HAND, WITH AND WITHOUT IV CONTRAST TECHNIQUE: Axial images were acquired through the right hand without and with IV contrast. 75 mL of Omnipaque  300 was administered intravenously. Reformatted images were reviewed. Automated exposure control, iterative reconstruction, and/or weight based adjustment of the mA/kV was utilized to reduce the radiation dose to as low as reasonably achievable. COMPARISON: None available. CLINICAL HISTORY: Hand pain, chronic, inflammatory arthritis suspected, xray done. FINDINGS: BONES AND JOINTS: No acute fracture or focal osseous lesion. No dislocation. Osteoarthritis observed with mild spurring and interarticular space narrowing of the interphalangeal joints with more striking degenerative spurring of the interphalangeal joint of the thumb. Mild spurring of the 1st metatarsal head and minimal spurring at the 1st carpometacarpal articulation. Radiocarpal osteoarthritis noted with particular loss of articular space between the distal radial articular surface and the lunate with associated degenerative subcortical cyst formation and subcortical sclerosis along the articulation between the distal radius and lunate as on image 27 series 7. Small degenerative subcortical cyst in the distal pole of the scaphoid. Mild spurring at the articulation of the scaphoid and the trapezium. Degenerative subcortical cyst in the head of the 3rd metacarpal. No well defined erosions. Dorsal spurring of the distal radial articular margin on image 46 series 8 with a chronically fragmented spur of the anterior radial lip on the same image. SOFT TISSUES: The soft tissues are unremarkable. IMPRESSION: 1. Osteoarthritis involving the interphalangeal joints (including the thumb interphalangeal joint), radiocarpal joint with distal radiuslunate joint space loss and associated subcortical cysts and sclerosis, scaphoid-trapezium articulation, and mild spurring at the 1st carpometacarpal articulation, consistent  with degenerative arthropathy. Electronically signed by: Ryan Salvage MD 03/23/2024 10:42 AM EST RP Workstation: [id number]   CT CHEST ABDOMEN PELVIS W CONTRAST Result Date: 03/21/2024 EXAM: CT CHEST, ABDOMEN AND PELVIS WITH CONTRAST  03/21/2024 03:11:07 AM TECHNIQUE: CT of the chest, abdomen and pelvis was performed with the administration of 100 mL of iohexol  (OMNIPAQUE ) 300 MG/ML solution intravenous contrast. Multiplanar reformatted images are provided for review. Automated exposure control, iterative reconstruction, and/or weight based adjustment of the mA/kV was utilized to reduce the radiation dose to as low as reasonably achievable. COMPARISON: Plain film and MRI from earlier in the same day. CLINICAL HISTORY: Lytic lesion within the pelvis, evaluate for additional lytic lesions . FINDINGS: CHEST: MEDIASTINUM AND LYMPH NODES: Heart is within normal limits. No coronary calcifications are noted. Pericardium is unremarkable. The central airways are clear. No mediastinal, hilar or axillary lymphadenopathy. The thoracic aorta is within normal limits. The pulmonary artery is visualized and is within normal limits. The esophagus as visualized is within normal limits. Thoracic inlet is within normal limits. LUNGS AND PLEURA: Lungs are well aerated bilaterally. Patchy atelectatic changes are noted in bases bilaterally. No sizable parenchymal nodule is noted. No pleural effusion or pneumothorax. ABDOMEN AND PELVIS: LIVER: Liver is within normal limits. GALLBLADDER AND BILE DUCTS: Gallbladder is within normal limits. No biliary ductal dilatation. SPLEEN: Spleen is unremarkable. PANCREAS: Pancreas is unremarkable. ADRENAL GLANDS: The adrenal glands are well visualized and within normal limits. KIDNEYS, URETERS AND BLADDER: Kidneys are well visualized and within normal limits with the exception of a tiny cyst within the left kidney. No follow-up is recommended. No stones in the kidneys or ureters. The bladder is  partially distended. GI AND BOWEL: Stomach and small bowel are within normal limits. No obstructive or inflammatory changes of the colon are noted. The appendix is within normal limits. There is no bowel obstruction. REPRODUCTIVE ORGANS: No acute abnormality. A small cyst is noted centrally within the seminal vesicle. PERITONEUM AND RETROPERITONEUM: No ascites. No free air. VASCULATURE: Aorta is normal in caliber. ABDOMINAL AND PELVIS LYMPH NODES: No lymphadenopathy. BONES AND SOFT TISSUES: Bony structures show a lytic lesion within the right acetabulum superiorly similar to that seen on recent MRI. Lytic changes are noted along the inferior aspect of the L5 vertebral body again, likely representing a schmorl's node. No other lytic lesions are seen. No focal soft tissue abnormality. IMPRESSION: 1. Lytic lesion within the right acetabulum superiorly, similar to recent MRI, with no additional lytic lesions identified. Electronically signed by: Oneil Devonshire MD 03/21/2024 03:28 AM EST RP Workstation: [id number]   MR PELVIS W WO CONTRAST Result Date: 03/20/2024 EXAM: MRI OF THE PELVIS WITH AND WITHOUT INTRAVENOUS CONTRAST 03/20/2024 05:09:16 PM TECHNIQUE: Multiplanar magnetic resonance images of the pelvis with and without intravenous contrast. COMPARISON: None available. CLINICAL HISTORY: Aggressive right acetabular lesion on radiography, right hip pain. FINDINGS: SOFT TISSUES: Low level adjacent edema tracking along the deep margin of the iliopsoas. Low level edema posteriorly in the left greater trochanter likely due to adjacent gluteus medius tendinopathy. JOINTS: No dislocation or significant effusion. LIMITED INTRAPELVIC CONTENTS: Utricle cyst or mullerian remnant along the posterior superior prostate gland. BONES: MRI confirms an aggressive lytic expansile 3.7 x 2.7 x 3.2 cm mass in the right upper acetabulum with surrounding marrow edema and enhancement. There is cortical demineralization/thinning associated  with this lesion. Enhancement along the inferior endplate of L5, please see the dedicated lumbar spine MRI report for further characterization. 0.8 cm mildly enhancing lesion in the ischial tuberosity on image 43 series 8, probably a hemangioma given the T1 signal characteristics, although technically nonspecific due to small size. Metal artifact from intramedullary nail in the right femur. IMPRESSION: 1. Aggressive lytic  expansile 3.7 x 2.7 x 3.2 cm mass in the right upper acetabulum with surrounding marrow edema, enhancement, and cortical demineralization/thinning. Lytic metastatic disease or myeloma are the top differential diagnostic considerations, correlate with serologic indicators in assessing for myeloma, and consider further technical imaging in the context of oncology workup. 2. 0.8 cm mildly enhancing lesion in the right ischial tuberosity, probably a hemangioma but technically nonspecific due to small size. Recommend attention on follow-up imaging. 3. Low level edema posteriorly in the left greater trochanter, likely due to adjacent gluteus medius tendinopathy. Electronically signed by: Ryan Salvage MD 03/20/2024 05:37 PM EST RP Workstation: [id number]   MR LUMBAR SPINE WO CONTRAST Result Date: 03/20/2024 EXAM: MRI LUMBAR SPINE 03/20/2024 05:09:16 PM TECHNIQUE: Multiplanar multisequence MRI of the lumbar spine was performed without the administration of intravenous contrast. COMPARISON: None available. CLINICAL HISTORY: Lumbar radiculopathy with right thigh pain. FINDINGS: BONES AND ALIGNMENT: Normal alignment. Normal vertebral body heights. Congenitally short pedicles in the lumbar spine. Focal inferior endplate irregularity with surrounding marrow edema along the inferior endplate of L5, most resembling an acute Schmorl node; however, given findings in the right acetabulum, the possibility of a metastatic lesion along the inferior endplate of L5 is not totally ruled out. Adjacent type 1 and  type 2 degenerative endplate findings noted. Background marrow signal is unremarkable unless otherwise stated. SPINAL CORD: Normal appearing conus medullaris terminates at L1-L2. SOFT TISSUES: No paraspinal mass. LIMITATIONS/ARTIFACTS: Motion artifact is present, reducing diagnostic sensitivity and specificity. DISC DESICCATION: Disc desiccation at L3-L4, L4-L5, and L1-L2. T12-L1: Unremarkable. L1-L2: Disc bulge. No impingement. L2-L3: Disc bulge and mild facet arthropathy. No impingement. L3-L4: Moderate central stenosis due to disc bulge and short pedicles. L4-L5: Moderate central stenosis with moderate right subarticular lateral recess stenosis and moderate right foraminal stenosis due to disc bulge, right lateral recess and foraminal disc protrusion, facet arthropathy, and short pedicles. L5-S1: Borderline bilateral foraminal stenosis due to short pedicles, disc osteophyte complex, and facet arthropathy. IMPRESSION: 1. Moderate central stenosis with moderate right subarticular lateral recess stenosis and moderate right foraminal stenosis at L4-5 due to disc bulge, right lateral recess and foraminal disc protrusion, facet arthropathy, and short pedicles. 2. Moderate central stenosis at L3-4 due to disc bulge and short pedicles. 3. Focal inferior endplate irregularity with surrounding marrow edema along the inferior endplate of L5, most resembling an acute Schmorl's node; metastatic lesion cannot be completely excluded. Electronically signed by: Ryan Salvage MD 03/20/2024 05:28 PM EST RP Workstation: [id number]   DG Femur Min 2 Views Right Result Date: 03/20/2024 EXAM: 2 VIEW(S) XRAY OF THE FEMUR 03/20/2024 01:05:00 PM COMPARISON: 03/29/2010 CLINICAL HISTORY: hip pain/ h/o femur fracture with hardware FINDINGS: BONES AND JOINTS: Intramedullary rod and screws in place traversing healed fracture deformity of mid femoral diaphysis, with single proximal and 2 distal interlocking screws. Demineralization of  the upper medial acetabulum, underlying lytic lesion not excluded. This is further discussed in the hip/pelvis radiograph report. No joint dislocation. SOFT TISSUES: The soft tissues are unremarkable. IMPRESSION: 1. Demineralization of the upper medial right acetabulum with possible underlying lytic lesion; refer to today's hip/pelvis radiograph report for further discussion. 2. Intramedullary nail traversing healed mid femoral diaphyseal fracture, unchanged hardware alignment. Electronically signed by: Ryan Salvage MD 03/20/2024 02:46 PM EST RP Workstation: [id number]   DG Hip Unilat With Pelvis 2-3 Views Right Result Date: 03/20/2024 EXAM: 2 or more VIEW(S) XRAY OF THE HIP 03/20/2024 01:05:00 PM COMPARISON: 03/29/2010 CLINICAL HISTORY: hip pain/ h/o femur fracture with hardware FINDINGS:  BONES AND JOINTS: Retrograde intramedullary nail in the right femur. Abnormal 2.1 cm lucency in the right upper medial acetabulum with demineralization of the cortical margin along the acetabulum concerning for a possible lytic lesion. Lytic osseous metastatic disease or myeloma not excluded. Dedicated MRI of the bony pelvis is recommended for more complete assessment. Mild degenerative chondral thinning in both hips. The hip joint is maintained. SOFT TISSUES: The soft tissues are unremarkable. IMPRESSION: 1. Abnormal 2.1 cm lucency in the right upper medial acetabulum with cortical demineralization, concerning for a lytic lesion; lytic osseous metastatic disease or myeloma not excluded, recommend dedicated MRI of the bony pelvis for further assessment. 2. Mild degenerative chondral thinning in both hips. Electronically signed by: Ryan Salvage MD 03/20/2024 02:43 PM EST RP Workstation: [id number]   DG Wrist Complete Right Result Date: 03/20/2024 EXAM: 3 OR MORE VIEW(S) XRAY OF THE WRIST 03/20/2024 12:16:00 PM COMPARISON: None available. CLINICAL HISTORY: pain swelling FINDINGS: BONES AND JOINTS: No acute  fracture. No focal osseous lesion. No joint dislocation. SOFT TISSUES: Mild soft tissue swelling. IMPRESSION: 1. Mild soft tissue swelling. 2. No acute fracture or dislocation. Electronically signed by: Donnice Mania MD 03/20/2024 12:49 PM EST RP Workstation: [id number]    Results are pending, will review when available.  Previous records (including but not limited to H&P, progress notes, nursing notes, TOC management) were reviewed in assessment of this patient.  Labs: CBC: Recent Labs  Lab 03/20/24 1235 03/20/24 1900 03/22/24 0448 03/24/24 0417 03/25/24 0421 03/26/24 0403  WBC 9.8 12.3* 12.1* 8.7 8.5 9.0  NEUTROABS 7.8* 9.9*  --   --   --   --   HGB 15.6 15.0 14.3 14.9 14.2 13.2  HCT 45.3 43.3 41.9 44.8 42.3 39.5  MCV 97.2 96.2 96.5 97.2 98.4 97.3  PLT 201 196 197 203 214 249   Basic Metabolic Panel: Recent Labs  Lab 03/20/24 1235 03/22/24 0448 03/24/24 0417 03/25/24 0421 03/26/24 0403  NA 137 135 137 137 136  K 4.0 4.3 4.6 4.5 4.3  CL 98 99 99 99 98  CO2 26 24 28 28 28   GLUCOSE 108* 133* 150* 141* 136*  BUN 17 17 16 15 15   CREATININE 1.31* 1.02 1.04 1.12 1.09  CALCIUM  9.8 9.2 9.0 9.1 9.2   Liver Function Tests: Recent Labs  Lab 03/22/24 1442 03/24/24 0417 03/25/24 0421  AST 19 30 59*  ALT 8 26 50*  ALKPHOS 92 65 64  BILITOT 0.6 0.5 0.5  PROT 7.8 7.7 7.4  ALBUMIN 3.7 3.5 3.2*   CBG: No results for input(s): GLUCAP in the last 168 hours.  Scheduled Meds:  atorvastatin   40 mg Oral Daily   colchicine  0.6 mg Oral Daily   diclofenac Sodium  2 g Topical QID   enoxaparin  (LOVENOX ) injection  40 mg Subcutaneous Q24H   losartan   50 mg Oral Daily   Continuous Infusions:  sodium chloride  10 mL/hr at 03/25/24 1519    ceFAZolin (ANCEF) IV 2 g (03/26/24 0547)   PRN Meds:.sodium chloride , acetaminophen , HYDROmorphone  (DILAUDID ) injection, oxyCODONE   Family Communication: None at bedside  Disposition: Status is: Inpatient Remains inpatient appropriate  because: Biopsy     Time spent: 40 minutes  Length of inpatient stay: 5 days  Author: Carliss LELON Canales, DO 03/26/2024 10:13 AM  For on call review [email or web address].

## 2024-03-26 NOTE — Procedures (Signed)
 Interventional Radiology Procedure Note  Procedure: CT CORE BX RT ACETABULAR LYTIC BONE LESION    Complications: None  Estimated Blood Loss:  MIN  Findings: 18G CORES IN FORMALIN    M. TREVOR Lonald Troiani, MD

## 2024-03-26 NOTE — Plan of Care (Signed)
  Problem: Pain Managment: Goal: General experience of comfort will improve and/or be controlled Outcome: Progressing   Problem: Skin Integrity: Goal: Risk for impaired skin integrity will decrease Outcome: Progressing   Problem: Clinical Measurements: Goal: Ability to avoid or minimize complications of infection will improve Outcome: Progressing   Problem: Skin Integrity: Goal: Skin integrity will improve Outcome: Progressing

## 2024-03-27 LAB — CBC
HCT: 39.2 % (ref 39.0–52.0)
Hemoglobin: 13.1 g/dL (ref 13.0–17.0)
MCH: 32.7 pg (ref 26.0–34.0)
MCHC: 33.4 g/dL (ref 30.0–36.0)
MCV: 97.8 fL (ref 80.0–100.0)
Platelets: 263 K/uL (ref 150–400)
RBC: 4.01 MIL/uL — ABNORMAL LOW (ref 4.22–5.81)
RDW: 14.2 % (ref 11.5–15.5)
WBC: 9.7 K/uL (ref 4.0–10.5)
nRBC: 0 % (ref 0.0–0.2)

## 2024-03-27 LAB — BASIC METABOLIC PANEL WITH GFR
Anion gap: 9 (ref 5–15)
BUN: 15 mg/dL (ref 8–23)
CO2: 27 mmol/L (ref 22–32)
Calcium: 9.6 mg/dL (ref 8.9–10.3)
Chloride: 99 mmol/L (ref 98–111)
Creatinine, Ser: 1.04 mg/dL (ref 0.61–1.24)
GFR, Estimated: >60 mL/min (ref >60)
Glucose, Bld: 152 mg/dL — ABNORMAL HIGH (ref 70–99)
Potassium: 4.5 mmol/L (ref 3.5–5.1)
Sodium: 135 mmol/L (ref 135–145)

## 2024-03-27 NOTE — Progress Notes (Addendum)
 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 Pt PMH includes but is not limited to: DM II, HTN, R femur fx and L knee surgery.    PT Comments  Pt agreeable to working with therapy. He reports improvement in pain control and mobility. He ambulated ~20 feet x 2 with RW this session-able to rest R hand on walker handle but not able to grip it. Education: R UE elevation with hand higher than elbow; gentle finger/wrist/elbow flexion,extension; gentle heel slides/hip ROM R LE as tolerated-instructed pt to terminate exercises if too painful. Patient will benefit from continued inpatient follow up therapy, <3 hours/day    If plan is discharge home, recommend the following: A little help with walking and/or transfers;A little help with bathing/dressing/bathroom;Assistance with cooking/housework;Assist for transportation;Help with stairs or ramp for entrance   Can travel by private vehicle     Yes  Equipment Recommendations   (TBD)    Recommendations for Other Services OT consult     Precautions / Restrictions Precautions Precautions: Fall; R UE sling for comfort Restrictions Weight Bearing Restrictions Per Provider Order: No     Mobility  Bed Mobility               General bed mobility comments: pt sitting EOB    Transfers Overall transfer level: Needs assistance Equipment used: Rolling walker (2 wheels) Transfers: Sit to/from Stand Sit to Stand: Min assist           General transfer comment: Steadying assistance provided. Increased time. Cues for safety.    Ambulation/Gait Ambulation/Gait assistance: Min assist Gait Distance (Feet): 20 Feet (x2) Assistive device: Rolling walker (2 wheels)         General Gait  Details: Pt able to take 2 laps around room with RW-seated rest break taken between walks. Steadying assist provded throughout distance. Gait mildly antalgic. Pt resting R hand on walker handle-unable to grip due to pain, swelling in hand. Mild lightheaedness reported as well.   Stairs             Wheelchair Mobility     Tilt Bed    Modified Rankin (Stroke Patients Only)       Balance Overall balance assessment: Needs assistance         Standing balance support: Bilateral upper extremity supported, During functional activity, Reliant on assistive device for balance Standing balance-Leahy Scale: Poor                              Communication Communication Communication: No apparent difficulties  Cognition Arousal: Alert Behavior During Therapy: WFL for tasks assessed/performed   PT - Cognitive impairments: No apparent impairments                         Following commands: Intact      Cueing    Exercises      General Comments        Pertinent Vitals/Pain Pain Assessment Pain Assessment: 0-10 Pain Score: 5  Pain Location: R hip and UE Pain Descriptors / Indicators: Aching, Discomfort, Constant, Guarding, Sore, Tender, Tightness Pain Intervention(s): Limited activity within patient's tolerance, Monitored during session, Repositioned    Home  Living                          Prior Function            PT Goals (current goals can now be found in the care plan section) Progress towards PT goals: Progressing toward goals    Frequency    Min 3X/week      PT Plan      Co-evaluation              AM-PAC PT 6 Clicks Mobility   Outcome Measure  Help needed turning from your back to your side while in a flat bed without using bedrails?: A Little Help needed moving from lying on your back to sitting on the side of a flat bed without using bedrails?: A Little Help needed moving to and from a bed to a chair  (including a wheelchair)?: A Little Help needed standing up from a chair using your arms (e.g., wheelchair or bedside chair)?: A Little Help needed to walk in hospital room?: A Little Help needed climbing 3-5 steps with a railing? : A Lot 6 Click Score: 17    End of Session   Activity Tolerance: Patient tolerated treatment well;Patient limited by pain Patient left: in bed;with call bell/phone within reach   PT Visit Diagnosis: Unsteadiness on feet (R26.81);Other abnormalities of gait and mobility (R26.89);Muscle weakness (generalized) (M62.81);Pain;Difficulty in walking, not elsewhere classified (R26.2) Pain - Right/Left: Right Pain - part of body: Arm;Hand;Hip;Leg     Time: 8688-8678 PT Time Calculation (min) (ACUTE ONLY): 10 min  Charges:    $Gait Training: 8-22 mins PT General Charges $$ ACUTE PT VISIT: 1 Visit                       Dannial SQUIBB, PT Acute Rehabilitation  Office: [phone]

## 2024-03-27 NOTE — Progress Notes (Addendum)
 Aaron Willis   DOB:[birth year]   FM#:[id number]      ASSESSMENT & PLAN:  Aaron Willis is a 63 year old male patient admitted 03/21/24 with complaints of hip and wrist pain. Imaging showed lytic lesion of hip. Medical Oncology following.   Lytic bone lesion, right acetabulum -- Imaging shows lytic mass 3.7x2.7x3.2 cm in right upper acetabulum.  Differential dx include lytic metastatic disease or myeloma. -- CT core biopsy of lytic bone lesion done by IR 03/26/24, pending path. -- Myeloma labs done, elevated IgG 1807 with M-spike 1.2.  Kappa FLC also elevated 67.3.  -- Medical Oncology/Dr. Lanny following closely and will make further recommendations.  Right Hip pain Difficulty with ambulation Weakness  -- likely secondary to lytic bone lesion -- Ambulating with walker -- Continue pain meds and supportive care -- PT on board. For discharge to SNF.   Right UE pain/right wrist pain -- Concern for cellulitis -- Right UE sling in use  -- Continue antibiotics as ordered -- Ortho following  Elevated PSA -- PSA elevated 15.04  Diabetes Hypertension -- Monitor blood glucose levels -- Monitor blood pressure -- Medicine managing    Code Status Full   Subjective:  Patient seen awake and alert sitting up on side of bed. Sling intact over right UE. Reports that pain of hip is okay when he is not moving. Right arm pain rated as 4 of 10.  No other acute complaints.     Objective:   Intake/Output Summary (Last 24 hours) at 03/27/2024 1146 Last data filed at 03/27/2024 0851 Gross per 24 hour  Intake 1754.58 ml  Output 1550 ml  Net 204.58 ml     PHYSICAL EXAMINATION: ECOG PERFORMANCE STATUS: 3 - Symptomatic, >50% confined to bed  Vitals:   03/27/24 0537 03/27/24 1134  BP: (!) 155/96 (!) 144/98  Pulse: 94 88  Resp: 15 16  Temp: 99.9 F (37.7 C) 98.5 F (36.9 C)  SpO2: 90% 99%   Filed Weights   03/21/24 0330  Weight: 224 lb 10.4 oz (101.9 kg)    GENERAL: alert, no  distress and comfortable SKIN: skin color, texture, turgor are normal, no rashes or significant lesions EYES: normal, conjunctiva are pink and non-injected, sclera clear OROPHARYNX: no exudate, no erythema and lips, buccal mucosa, and tongue normal  NECK: supple, thyroid normal size, non-tender, without nodularity LYMPH: no palpable lymphadenopathy in the cervical, axillary or inguinal LUNGS: clear to auscultation and percussion with normal breathing effort HEART: regular rate & rhythm and no murmurs and no lower extremity edema ABDOMEN: abdomen soft, non-tender and normal bowel sounds MUSCULOSKELETAL: +difficulty with ambulation +sling RUE  PSYCH: alert & oriented x 3 with fluent speech NEURO: no focal motor/sensory deficits   All questions were answered. The patient knows to call the clinic with any problems, questions or concerns.   The total time spent in the appointment was 40 minutes encounter with patient including review of chart and various tests results, discussions about plan of care and coordination of care plan  Olam JINNY Brunner, NP 03/27/2024 11:46 AM    Labs Reviewed:  Lab Results  Component Value Date   WBC 9.7 03/27/2024   HGB 13.1 03/27/2024   HCT 39.2 03/27/2024   MCV 97.8 03/27/2024   PLT 263 03/27/2024   Recent Labs    03/22/24 1442 03/24/24 0417 03/25/24 0421 03/26/24 0403 03/27/24 0455  NA  --  137 137 136 135  K  --  4.6 4.5 4.3 4.5  CL  --  99 99 98 99  CO2  --  28 28 28 27   GLUCOSE  --  150* 141* 136* 152*  BUN  --  16 15 15 15   CREATININE  --  1.04 1.12 1.09 1.04  CALCIUM   --  9.0 9.1 9.2 9.6  GFRNONAA  --  >60 >60 >60 >60  PROT 7.8 7.7 7.4  --   --   ALBUMIN 3.7 3.5 3.2*  --   --   AST 19 30 59*  --   --   ALT 8 26 50*  --   --   ALKPHOS 92 65 64  --   --   BILITOT 0.6 0.5 0.5  --   --   BILIDIR 0.3*  --   --   --   --   IBILI 0.3  --   --   --   --     Studies Reviewed:  CT BONE TROCAR/NEEDLE BIOPSY DEEP Result Date:  03/26/2024 INDICATION: Lytic right iliac acetabular bone lesion EXAM: CT CORE BIOPSY RIGHT ACETABULAR LYTIC BONE LESION TECHNIQUE: Multidetector CT imaging of the PELVIS was performed following the standard protocol without IV contrast. RADIATION DOSE REDUCTION: This exam was performed according to the departmental dose-optimization program which includes automated exposure control, adjustment of the mA and/or kV according to patient size and/or use of iterative reconstruction technique. MEDICATIONS: 1% lidocaine local ANESTHESIA/SEDATION: Moderate (conscious) sedation was employed during this procedure. A total of Versed 2.0 mg and Fentanyl 100 mcg was administered intravenously by the radiology nurse. Total intra-service moderate Sedation Time: 10 minutes. The patient's level of consciousness and vital signs were monitored continuously by radiology nursing throughout the procedure under my direct supervision. COMPLICATIONS: None immediate. PROCEDURE: Informed written consent was obtained from the patient after a thorough discussion of the procedural risks, benefits and alternatives. All questions were addressed. Maximal Sterile Barrier Technique was utilized including caps, mask, sterile gowns, sterile gloves, sterile drape, hand hygiene and skin antiseptic. A timeout was performed prior to the initiation of the procedure. Previous imaging reviewed. Patient positioned supine. Noncontrast localization CT performed. The right iliac acetabular lytic bone lesion was localized and marked for an anterior approach. Under sterile conditions and local anesthesia, the 17 gauge guide needle was advanced from an anterior oblique approach into the right acetabular lytic bone lesion. Needle position confirmed with CT. 18 gauge core biopsies obtained of the soft tissue lytic component. Samples were intact and non fragmented. These were placed in formalin. Needle removed. Postprocedure imaging demonstrates no hemorrhage or  hematoma. Patient tolerated the biopsy well. IMPRESSION: Successful CT-guided right iliac acetabular lytic bone lesion 18 gauge core biopsy. Electronically Signed   By: CHRISTELLA.  Shick M.D.   On: 03/26/2024 15:08   MR WRIST RIGHT W WO CONTRAST Result Date: 03/25/2024 CLINICAL DATA:  Wrist pain, chronic, inflammatory arthritis suspected, xray done EXAM: MR OF THE RIGHT WRIST WITHOUT AND WITH CONTRAST TECHNIQUE: Multiplanar multisequence MR imaging of the right wrist was performed both before and after the administration of intravenous contrast. CONTRAST:  10mL GADAVIST  GADOBUTROL  1 MMOL/ML IV SOLN COMPARISON:  Right wrist radiographs 03/20/2024. CT of the right hand 03/22/2024. FINDINGS: Ligaments: The scapholunate and lunotriquetral ligaments appear intact. Triangular fibrocartilage: Probable small central degenerative tear of the triangular fibrocartilage, best seen on the coronal images. The ulnar variance is neutral. Tendons: The flexor and extensor tendons appear intact. There is a small to moderate amount of extensor tendon sheath fluid and synovial  enhancement following contrast. There is also mild synovial enhancement within the carpal tunnel. Carpal tunnel/median nerve: Unremarkable. Guyon's canal: Unremarkable. Joint/cartilage: As seen on radiographs and CT, there are moderate radiocarpal and intercarpal degenerative changes with scattered subchondral cysts and osteophytes. Degenerative changes are also present at the 1st carpometacarpal and visualized metacarpal phalangeal joints. Moderate amount of fluid within the midcarpal, radiocarpal and distal radioulnar compartments with associated mild synovial enhancement following contrast. Bones/carpal alignment: The carpal bone alignment is normal.No evidence of acute fracture, dislocation or osteomyelitis. Other: Moderate generalized subcutaneous edema throughout the dorsal soft tissues of the wrist and hand. Mild associated subcutaneous enhancement, suggesting  possible cellulitis. No organized fluid collection, foreign body or soft tissue emphysema identified. IMPRESSION: 1. Moderate generalized subcutaneous edema throughout the dorsal soft tissues of the wrist and hand with mild associated subcutaneous enhancement, suggesting possible cellulitis. No organized fluid collection, foreign body or soft tissue emphysema identified. 2. Moderate radiocarpal and intercarpal degenerative changes with joint effusions, scattered subchondral cysts and osteophytes. Findings could be secondary to osteoarthritis, inflammatory or crystalline arthropathy. No evidence of osteomyelitis. 3. Mild extensor and flexor tenosynovitis. 4. Probable small central degenerative tear of the triangular fibrocartilage. Electronically Signed   By: Elsie Perone M.D.   On: 03/25/2024 09:52   CT HAND RIGHT W WO CONTRAST Result Date: 03/23/2024 EXAM: CT RIGHT HAND, WITH AND WITHOUT IV CONTRAST TECHNIQUE: Axial images were acquired through the right hand without and with IV contrast. 75 mL of Omnipaque  300 was administered intravenously. Reformatted images were reviewed. Automated exposure control, iterative reconstruction, and/or weight based adjustment of the mA/kV was utilized to reduce the radiation dose to as low as reasonably achievable. COMPARISON: None available. CLINICAL HISTORY: Hand pain, chronic, inflammatory arthritis suspected, xray done. FINDINGS: BONES AND JOINTS: No acute fracture or focal osseous lesion. No dislocation. Osteoarthritis observed with mild spurring and interarticular space narrowing of the interphalangeal joints with more striking degenerative spurring of the interphalangeal joint of the thumb. Mild spurring of the 1st metatarsal head and minimal spurring at the 1st carpometacarpal articulation. Radiocarpal osteoarthritis noted with particular loss of articular space between the distal radial articular surface and the lunate with associated degenerative subcortical cyst  formation and subcortical sclerosis along the articulation between the distal radius and lunate as on image 27 series 7. Small degenerative subcortical cyst in the distal pole of the scaphoid. Mild spurring at the articulation of the scaphoid and the trapezium. Degenerative subcortical cyst in the head of the 3rd metacarpal. No well defined erosions. Dorsal spurring of the distal radial articular margin on image 46 series 8 with a chronically fragmented spur of the anterior radial lip on the same image. SOFT TISSUES: The soft tissues are unremarkable. IMPRESSION: 1. Osteoarthritis involving the interphalangeal joints (including the thumb interphalangeal joint), radiocarpal joint with distal radiuslunate joint space loss and associated subcortical cysts and sclerosis, scaphoid-trapezium articulation, and mild spurring at the 1st carpometacarpal articulation, consistent with degenerative arthropathy. Electronically signed by: Ryan Salvage MD 03/23/2024 10:42 AM EST RP Workstation: [id number]   CT CHEST ABDOMEN PELVIS W CONTRAST Result Date: 03/21/2024 EXAM: CT CHEST, ABDOMEN AND PELVIS WITH CONTRAST 03/21/2024 03:11:07 AM TECHNIQUE: CT of the chest, abdomen and pelvis was performed with the administration of 100 mL of iohexol  (OMNIPAQUE ) 300 MG/ML solution intravenous contrast. Multiplanar reformatted images are provided for review. Automated exposure control, iterative reconstruction, and/or weight based adjustment of the mA/kV was utilized to reduce the radiation dose to as low as reasonably achievable. COMPARISON: Plain  film and MRI from earlier in the same day. CLINICAL HISTORY: Lytic lesion within the pelvis, evaluate for additional lytic lesions . FINDINGS: CHEST: MEDIASTINUM AND LYMPH NODES: Heart is within normal limits. No coronary calcifications are noted. Pericardium is unremarkable. The central airways are clear. No mediastinal, hilar or axillary lymphadenopathy. The thoracic aorta is within  normal limits. The pulmonary artery is visualized and is within normal limits. The esophagus as visualized is within normal limits. Thoracic inlet is within normal limits. LUNGS AND PLEURA: Lungs are well aerated bilaterally. Patchy atelectatic changes are noted in bases bilaterally. No sizable parenchymal nodule is noted. No pleural effusion or pneumothorax. ABDOMEN AND PELVIS: LIVER: Liver is within normal limits. GALLBLADDER AND BILE DUCTS: Gallbladder is within normal limits. No biliary ductal dilatation. SPLEEN: Spleen is unremarkable. PANCREAS: Pancreas is unremarkable. ADRENAL GLANDS: The adrenal glands are well visualized and within normal limits. KIDNEYS, URETERS AND BLADDER: Kidneys are well visualized and within normal limits with the exception of a tiny cyst within the left kidney. No follow-up is recommended. No stones in the kidneys or ureters. The bladder is partially distended. GI AND BOWEL: Stomach and small bowel are within normal limits. No obstructive or inflammatory changes of the colon are noted. The appendix is within normal limits. There is no bowel obstruction. REPRODUCTIVE ORGANS: No acute abnormality. A small cyst is noted centrally within the seminal vesicle. PERITONEUM AND RETROPERITONEUM: No ascites. No free air. VASCULATURE: Aorta is normal in caliber. ABDOMINAL AND PELVIS LYMPH NODES: No lymphadenopathy. BONES AND SOFT TISSUES: Bony structures show a lytic lesion within the right acetabulum superiorly similar to that seen on recent MRI. Lytic changes are noted along the inferior aspect of the L5 vertebral body again, likely representing a schmorl's node. No other lytic lesions are seen. No focal soft tissue abnormality. IMPRESSION: 1. Lytic lesion within the right acetabulum superiorly, similar to recent MRI, with no additional lytic lesions identified. Electronically signed by: Oneil Devonshire MD 03/21/2024 03:28 AM EST RP Workstation: [id number]   MR PELVIS W WO CONTRAST Result Date:  03/20/2024 EXAM: MRI OF THE PELVIS WITH AND WITHOUT INTRAVENOUS CONTRAST 03/20/2024 05:09:16 PM TECHNIQUE: Multiplanar magnetic resonance images of the pelvis with and without intravenous contrast. COMPARISON: None available. CLINICAL HISTORY: Aggressive right acetabular lesion on radiography, right hip pain. FINDINGS: SOFT TISSUES: Low level adjacent edema tracking along the deep margin of the iliopsoas. Low level edema posteriorly in the left greater trochanter likely due to adjacent gluteus medius tendinopathy. JOINTS: No dislocation or significant effusion. LIMITED INTRAPELVIC CONTENTS: Utricle cyst or mullerian remnant along the posterior superior prostate gland. BONES: MRI confirms an aggressive lytic expansile 3.7 x 2.7 x 3.2 cm mass in the right upper acetabulum with surrounding marrow edema and enhancement. There is cortical demineralization/thinning associated with this lesion. Enhancement along the inferior endplate of L5, please see the dedicated lumbar spine MRI report for further characterization. 0.8 cm mildly enhancing lesion in the ischial tuberosity on image 43 series 8, probably a hemangioma given the T1 signal characteristics, although technically nonspecific due to small size. Metal artifact from intramedullary nail in the right femur. IMPRESSION: 1. Aggressive lytic expansile 3.7 x 2.7 x 3.2 cm mass in the right upper acetabulum with surrounding marrow edema, enhancement, and cortical demineralization/thinning. Lytic metastatic disease or myeloma are the top differential diagnostic considerations, correlate with serologic indicators in assessing for myeloma, and consider further technical imaging in the context of oncology workup. 2. 0.8 cm mildly enhancing lesion in the right ischial  tuberosity, probably a hemangioma but technically nonspecific due to small size. Recommend attention on follow-up imaging. 3. Low level edema posteriorly in the left greater trochanter, likely due to adjacent  gluteus medius tendinopathy. Electronically signed by: Ryan Salvage MD 03/20/2024 05:37 PM EST RP Workstation: [id number]   MR LUMBAR SPINE WO CONTRAST Result Date: 03/20/2024 EXAM: MRI LUMBAR SPINE 03/20/2024 05:09:16 PM TECHNIQUE: Multiplanar multisequence MRI of the lumbar spine was performed without the administration of intravenous contrast. COMPARISON: None available. CLINICAL HISTORY: Lumbar radiculopathy with right thigh pain. FINDINGS: BONES AND ALIGNMENT: Normal alignment. Normal vertebral body heights. Congenitally short pedicles in the lumbar spine. Focal inferior endplate irregularity with surrounding marrow edema along the inferior endplate of L5, most resembling an acute Schmorl node; however, given findings in the right acetabulum, the possibility of a metastatic lesion along the inferior endplate of L5 is not totally ruled out. Adjacent type 1 and type 2 degenerative endplate findings noted. Background marrow signal is unremarkable unless otherwise stated. SPINAL CORD: Normal appearing conus medullaris terminates at L1-L2. SOFT TISSUES: No paraspinal mass. LIMITATIONS/ARTIFACTS: Motion artifact is present, reducing diagnostic sensitivity and specificity. DISC DESICCATION: Disc desiccation at L3-L4, L4-L5, and L1-L2. T12-L1: Unremarkable. L1-L2: Disc bulge. No impingement. L2-L3: Disc bulge and mild facet arthropathy. No impingement. L3-L4: Moderate central stenosis due to disc bulge and short pedicles. L4-L5: Moderate central stenosis with moderate right subarticular lateral recess stenosis and moderate right foraminal stenosis due to disc bulge, right lateral recess and foraminal disc protrusion, facet arthropathy, and short pedicles. L5-S1: Borderline bilateral foraminal stenosis due to short pedicles, disc osteophyte complex, and facet arthropathy. IMPRESSION: 1. Moderate central stenosis with moderate right subarticular lateral recess stenosis and moderate right foraminal stenosis at  L4-5 due to disc bulge, right lateral recess and foraminal disc protrusion, facet arthropathy, and short pedicles. 2. Moderate central stenosis at L3-4 due to disc bulge and short pedicles. 3. Focal inferior endplate irregularity with surrounding marrow edema along the inferior endplate of L5, most resembling an acute Schmorl's node; metastatic lesion cannot be completely excluded. Electronically signed by: Ryan Salvage MD 03/20/2024 05:28 PM EST RP Workstation: [id number]   DG Femur Min 2 Views Right Result Date: 03/20/2024 EXAM: 2 VIEW(S) XRAY OF THE FEMUR 03/20/2024 01:05:00 PM COMPARISON: 03/29/2010 CLINICAL HISTORY: hip pain/ h/o femur fracture with hardware FINDINGS: BONES AND JOINTS: Intramedullary rod and screws in place traversing healed fracture deformity of mid femoral diaphysis, with single proximal and 2 distal interlocking screws. Demineralization of the upper medial acetabulum, underlying lytic lesion not excluded. This is further discussed in the hip/pelvis radiograph report. No joint dislocation. SOFT TISSUES: The soft tissues are unremarkable. IMPRESSION: 1. Demineralization of the upper medial right acetabulum with possible underlying lytic lesion; refer to today's hip/pelvis radiograph report for further discussion. 2. Intramedullary nail traversing healed mid femoral diaphyseal fracture, unchanged hardware alignment. Electronically signed by: Ryan Salvage MD 03/20/2024 02:46 PM EST RP Workstation: [id number]   DG Hip Unilat With Pelvis 2-3 Views Right Result Date: 03/20/2024 EXAM: 2 or more VIEW(S) XRAY OF THE HIP 03/20/2024 01:05:00 PM COMPARISON: 03/29/2010 CLINICAL HISTORY: hip pain/ h/o femur fracture with hardware FINDINGS: BONES AND JOINTS: Retrograde intramedullary nail in the right femur. Abnormal 2.1 cm lucency in the right upper medial acetabulum with demineralization of the cortical margin along the acetabulum concerning for a possible lytic lesion. Lytic osseous  metastatic disease or myeloma not excluded. Dedicated MRI of the bony pelvis is recommended for more complete assessment. Mild degenerative chondral thinning in  both hips. The hip joint is maintained. SOFT TISSUES: The soft tissues are unremarkable. IMPRESSION: 1. Abnormal 2.1 cm lucency in the right upper medial acetabulum with cortical demineralization, concerning for a lytic lesion; lytic osseous metastatic disease or myeloma not excluded, recommend dedicated MRI of the bony pelvis for further assessment. 2. Mild degenerative chondral thinning in both hips. Electronically signed by: Ryan Salvage MD 03/20/2024 02:43 PM EST RP Workstation: [id number]   DG Wrist Complete Right Result Date: 03/20/2024 EXAM: 3 OR MORE VIEW(S) XRAY OF THE WRIST 03/20/2024 12:16:00 PM COMPARISON: None available. CLINICAL HISTORY: pain swelling FINDINGS: BONES AND JOINTS: No acute fracture. No focal osseous lesion. No joint dislocation. SOFT TISSUES: Mild soft tissue swelling. IMPRESSION: 1. Mild soft tissue swelling. 2. No acute fracture or dislocation. Electronically signed by: Donnice Mania MD 03/20/2024 12:49 PM EST RP Workstation: [id number]     ADDENDUM I have seen the patient, examined him. I agree with the assessment and and plan and have edited the notes.   His SPEP was positive for M protein 1.2, and immunofixation showed IgG monoclonal protein with kappa light chain.  His serum kappa light chain was elevated at 67.3, with ratio 9.22.  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  He does not have anemia, thrombocytopenia, renal failure or elevated calcium  level.  I recommend a bone marrow biopsy to see the percentage of plasma cells in the bone marrow, he will also need a PET scan after hospital discharge, to determine if he has multiple myeloma, versus plasmacytoma.  Based on the above information, we will determine chemotherapy versus radiation treatment.  I  reviewed the above with the patient in detail, he is in agreement.  I ordered IR CT-guided bone marrow biopsy in the next few days, okay to discharge after that, from my standpoint. I will schedule his f/u in my clinic.   Onita Mattock MD 03/27/2024

## 2024-03-27 NOTE — Plan of Care (Signed)

## 2024-03-27 NOTE — Plan of Care (Signed)
   Problem: Education: Goal: Knowledge of General Education information will improve Description Including pain rating scale, medication(s)/side effects and non-pharmacologic comfort measures Outcome: Progressing   Problem: Health Behavior/Discharge Planning: Goal: Ability to manage health-related needs will improve Outcome: Progressing

## 2024-03-27 NOTE — Progress Notes (Signed)
 Progress Note   Patient: Aaron Willis FMW:[id number] DOB: 04-18-1961 DOA: 03/20/2024  DOS: the patient was seen and examined on 03/27/2024   Brief hospital course:  63 y.o. male with medical history significant of hypertension, use who presented to the emergency department with right hip pain and arm pain.    Assessment and Plan:   Lytic lesions in right acetabulum/right hip pain - MRI pelvis noting aggressive lytic expansile 3.7 x 2.7 x 3.2 cm mass in the right upper acetabulum with surrounding marrow edema, enhancement, and cortical demineralization/thinning.  Concern for lytic multiple myeloma, osteosarcoma, or metastatic disease.  CT chest abdomen pelvis unrevealing.  No hypercalcemia, anemia, renal dysfunction to suggest MM.  Oncology consulted and following closely.  S/P IR biopsy 12/1.  Pain control improved with as needed Dilaudid , oxycodone  10 mg every 6 hours as needed.    Right wrist pain-concern for developing cellulitis - X-ray imaging unremarkable.  However appeared to be edematous and slightly erythematous.  Patient denies any injury or fall.  Differentials include cellulitis versus underlying inflammatory arthritis.  Uric acid normal.  CT of the wrist and hand noting only arthritic changes.  No leukocytosis or fever either.  Initiated empiric cefazolin 11/28 with ice, elevation, Voltaren gel.  Initiated p.o. colchicine (NSAID allergy) on 11/29.  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  Showing improvement on current regimen.  Plan to transition to p.o. Keflex upon discharge.  Continue colchicine.   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  Does not appear to be acute.  However may be contributing to underlying pain or weakness.  PT following.   Physical debilitation and muscle weakness - Exacerbated by pain from above.  Opioid therapy on  board.  PT following.  Currently recommending SNF.   Subjective: Patient resting comfortably this morning.  States he continues to show improvement in his right wrist pain.  Tolerated biopsy procedure yesterday.  Denies shortness of breath, fever, chills, chest pain, nausea, vomiting, abdominal pain.  Eager to transition to rehab.  Physical Exam:  Vitals:   03/26/24 1401 03/26/24 1623 03/26/24 2050 03/27/24 0537  BP: (!) 155/91 136/81 (!) 170/89 (!) 155/96  Pulse: 89 93 94 94  Resp: 16 16 15 15   Temp: 99.1 F (37.3 C) 98.9 F (37.2 C) 99.8 F (37.7 C) 99.9 F (37.7 C)  TempSrc: Oral Oral Oral Oral  SpO2: 95% 94% 98% 90%  Weight:      Height:        GENERAL:  Alert, pleasant  HEENT:  EOMI CARDIOVASCULAR:  RRR, no murmurs appreciated RESPIRATORY:  Clear to auscultation, no wheezing, rales, or rhonchi GASTROINTESTINAL:  Soft, nontender, nondistended EXTREMITIES: Right wrist with improving edema and erythema, ROM improved but limited by pain NEURO:  No new focal deficits appreciated SKIN:  No rashes noted PSYCH:  Appropriate mood and affect    Data Reviewed:  Imaging Studies: CT BONE TROCAR/NEEDLE BIOPSY DEEP Result Date: 03/26/2024 INDICATION: Lytic right iliac acetabular bone lesion EXAM: CT CORE BIOPSY RIGHT ACETABULAR LYTIC BONE LESION TECHNIQUE: Multidetector CT imaging of the PELVIS was performed following the standard protocol without IV contrast. RADIATION DOSE REDUCTION: This exam was performed according to the departmental dose-optimization program which includes automated exposure control, adjustment of the mA and/or kV according to patient size and/or use of iterative reconstruction technique. MEDICATIONS: 1% lidocaine local ANESTHESIA/SEDATION: Moderate (conscious) sedation was employed  during this procedure. A total of Versed 2.0 mg and Fentanyl 100 mcg was administered intravenously by the radiology nurse. Total intra-service moderate Sedation Time: 10 minutes. The  patient's level of consciousness and vital signs were monitored continuously by radiology nursing throughout the procedure under my direct supervision. COMPLICATIONS: None immediate. PROCEDURE: Informed written consent was obtained from the patient after a thorough discussion of the procedural risks, benefits and alternatives. All questions were addressed. Maximal Sterile Barrier Technique was utilized including caps, mask, sterile gowns, sterile gloves, sterile drape, hand hygiene and skin antiseptic. A timeout was performed prior to the initiation of the procedure. Previous imaging reviewed. Patient positioned supine. Noncontrast localization CT performed. The right iliac acetabular lytic bone lesion was localized and marked for an anterior approach. Under sterile conditions and local anesthesia, the 17 gauge guide needle was advanced from an anterior oblique approach into the right acetabular lytic bone lesion. Needle position confirmed with CT. 18 gauge core biopsies obtained of the soft tissue lytic component. Samples were intact and non fragmented. These were placed in formalin. Needle removed. Postprocedure imaging demonstrates no hemorrhage or hematoma. Patient tolerated the biopsy well. IMPRESSION: Successful CT-guided right iliac acetabular lytic bone lesion 18 gauge core biopsy. Electronically Signed   By: CHRISTELLA.  Shick M.D.   On: 03/26/2024 15:08   MR WRIST RIGHT W WO CONTRAST Result Date: 03/25/2024 CLINICAL DATA:  Wrist pain, chronic, inflammatory arthritis suspected, xray done EXAM: MR OF THE RIGHT WRIST WITHOUT AND WITH CONTRAST TECHNIQUE: Multiplanar multisequence MR imaging of the right wrist was performed both before and after the administration of intravenous contrast. CONTRAST:  10mL GADAVIST  GADOBUTROL  1 MMOL/ML IV SOLN COMPARISON:  Right wrist radiographs 03/20/2024. CT of the right hand 03/22/2024. FINDINGS: Ligaments: The scapholunate and lunotriquetral ligaments appear intact. Triangular  fibrocartilage: Probable small central degenerative tear of the triangular fibrocartilage, best seen on the coronal images. The ulnar variance is neutral. Tendons: The flexor and extensor tendons appear intact. There is a small to moderate amount of extensor tendon sheath fluid and synovial enhancement following contrast. There is also mild synovial enhancement within the carpal tunnel. Carpal tunnel/median nerve: Unremarkable. Guyon's canal: Unremarkable. Joint/cartilage: As seen on radiographs and CT, there are moderate radiocarpal and intercarpal degenerative changes with scattered subchondral cysts and osteophytes. Degenerative changes are also present at the 1st carpometacarpal and visualized metacarpal phalangeal joints. Moderate amount of fluid within the midcarpal, radiocarpal and distal radioulnar compartments with associated mild synovial enhancement following contrast. Bones/carpal alignment: The carpal bone alignment is normal.No evidence of acute fracture, dislocation or osteomyelitis. Other: Moderate generalized subcutaneous edema throughout the dorsal soft tissues of the wrist and hand. Mild associated subcutaneous enhancement, suggesting possible cellulitis. No organized fluid collection, foreign body or soft tissue emphysema identified. IMPRESSION: 1. Moderate generalized subcutaneous edema throughout the dorsal soft tissues of the wrist and hand with mild associated subcutaneous enhancement, suggesting possible cellulitis. No organized fluid collection, foreign body or soft tissue emphysema identified. 2. Moderate radiocarpal and intercarpal degenerative changes with joint effusions, scattered subchondral cysts and osteophytes. Findings could be secondary to osteoarthritis, inflammatory or crystalline arthropathy. No evidence of osteomyelitis. 3. Mild extensor and flexor tenosynovitis. 4. Probable small central degenerative tear of the triangular fibrocartilage. Electronically Signed   By: Elsie Perone M.D.   On: 03/25/2024 09:52   CT HAND RIGHT W WO CONTRAST Result Date: 03/23/2024 EXAM: CT RIGHT HAND, WITH AND WITHOUT IV CONTRAST TECHNIQUE: Axial images were acquired through the right hand without and  with IV contrast. 75 mL of Omnipaque  300 was administered intravenously. Reformatted images were reviewed. Automated exposure control, iterative reconstruction, and/or weight based adjustment of the mA/kV was utilized to reduce the radiation dose to as low as reasonably achievable. COMPARISON: None available. CLINICAL HISTORY: Hand pain, chronic, inflammatory arthritis suspected, xray done. FINDINGS: BONES AND JOINTS: No acute fracture or focal osseous lesion. No dislocation. Osteoarthritis observed with mild spurring and interarticular space narrowing of the interphalangeal joints with more striking degenerative spurring of the interphalangeal joint of the thumb. Mild spurring of the 1st metatarsal head and minimal spurring at the 1st carpometacarpal articulation. Radiocarpal osteoarthritis noted with particular loss of articular space between the distal radial articular surface and the lunate with associated degenerative subcortical cyst formation and subcortical sclerosis along the articulation between the distal radius and lunate as on image 27 series 7. Small degenerative subcortical cyst in the distal pole of the scaphoid. Mild spurring at the articulation of the scaphoid and the trapezium. Degenerative subcortical cyst in the head of the 3rd metacarpal. No well defined erosions. Dorsal spurring of the distal radial articular margin on image 46 series 8 with a chronically fragmented spur of the anterior radial lip on the same image. SOFT TISSUES: The soft tissues are unremarkable. IMPRESSION: 1. Osteoarthritis involving the interphalangeal joints (including the thumb interphalangeal joint), radiocarpal joint with distal radiuslunate joint space loss and associated subcortical cysts and sclerosis,  scaphoid-trapezium articulation, and mild spurring at the 1st carpometacarpal articulation, consistent with degenerative arthropathy. Electronically signed by: Ryan Salvage MD 03/23/2024 10:42 AM EST RP Workstation: [id number]   CT CHEST ABDOMEN PELVIS W CONTRAST Result Date: 03/21/2024 EXAM: CT CHEST, ABDOMEN AND PELVIS WITH CONTRAST 03/21/2024 03:11:07 AM TECHNIQUE: CT of the chest, abdomen and pelvis was performed with the administration of 100 mL of iohexol  (OMNIPAQUE ) 300 MG/ML solution intravenous contrast. Multiplanar reformatted images are provided for review. Automated exposure control, iterative reconstruction, and/or weight based adjustment of the mA/kV was utilized to reduce the radiation dose to as low as reasonably achievable. COMPARISON: Plain film and MRI from earlier in the same day. CLINICAL HISTORY: Lytic lesion within the pelvis, evaluate for additional lytic lesions . FINDINGS: CHEST: MEDIASTINUM AND LYMPH NODES: Heart is within normal limits. No coronary calcifications are noted. Pericardium is unremarkable. The central airways are clear. No mediastinal, hilar or axillary lymphadenopathy. The thoracic aorta is within normal limits. The pulmonary artery is visualized and is within normal limits. The esophagus as visualized is within normal limits. Thoracic inlet is within normal limits. LUNGS AND PLEURA: Lungs are well aerated bilaterally. Patchy atelectatic changes are noted in bases bilaterally. No sizable parenchymal nodule is noted. No pleural effusion or pneumothorax. ABDOMEN AND PELVIS: LIVER: Liver is within normal limits. GALLBLADDER AND BILE DUCTS: Gallbladder is within normal limits. No biliary ductal dilatation. SPLEEN: Spleen is unremarkable. PANCREAS: Pancreas is unremarkable. ADRENAL GLANDS: The adrenal glands are well visualized and within normal limits. KIDNEYS, URETERS AND BLADDER: Kidneys are well visualized and within normal limits with the exception of a tiny cyst  within the left kidney. No follow-up is recommended. No stones in the kidneys or ureters. The bladder is partially distended. GI AND BOWEL: Stomach and small bowel are within normal limits. No obstructive or inflammatory changes of the colon are noted. The appendix is within normal limits. There is no bowel obstruction. REPRODUCTIVE ORGANS: No acute abnormality. A small cyst is noted centrally within the seminal vesicle. PERITONEUM AND RETROPERITONEUM: No ascites. No free air. VASCULATURE:  Aorta is normal in caliber. ABDOMINAL AND PELVIS LYMPH NODES: No lymphadenopathy. BONES AND SOFT TISSUES: Bony structures show a lytic lesion within the right acetabulum superiorly similar to that seen on recent MRI. Lytic changes are noted along the inferior aspect of the L5 vertebral body again, likely representing a schmorl's node. No other lytic lesions are seen. No focal soft tissue abnormality. IMPRESSION: 1. Lytic lesion within the right acetabulum superiorly, similar to recent MRI, with no additional lytic lesions identified. Electronically signed by: Oneil Devonshire MD 03/21/2024 03:28 AM EST RP Workstation: [id number]   MR PELVIS W WO CONTRAST Result Date: 03/20/2024 EXAM: MRI OF THE PELVIS WITH AND WITHOUT INTRAVENOUS CONTRAST 03/20/2024 05:09:16 PM TECHNIQUE: Multiplanar magnetic resonance images of the pelvis with and without intravenous contrast. COMPARISON: None available. CLINICAL HISTORY: Aggressive right acetabular lesion on radiography, right hip pain. FINDINGS: SOFT TISSUES: Low level adjacent edema tracking along the deep margin of the iliopsoas. Low level edema posteriorly in the left greater trochanter likely due to adjacent gluteus medius tendinopathy. JOINTS: No dislocation or significant effusion. LIMITED INTRAPELVIC CONTENTS: Utricle cyst or mullerian remnant along the posterior superior prostate gland. BONES: MRI confirms an aggressive lytic expansile 3.7 x 2.7 x 3.2 cm mass in the right upper  acetabulum with surrounding marrow edema and enhancement. There is cortical demineralization/thinning associated with this lesion. Enhancement along the inferior endplate of L5, please see the dedicated lumbar spine MRI report for further characterization. 0.8 cm mildly enhancing lesion in the ischial tuberosity on image 43 series 8, probably a hemangioma given the T1 signal characteristics, although technically nonspecific due to small size. Metal artifact from intramedullary nail in the right femur. IMPRESSION: 1. Aggressive lytic expansile 3.7 x 2.7 x 3.2 cm mass in the right upper acetabulum with surrounding marrow edema, enhancement, and cortical demineralization/thinning. Lytic metastatic disease or myeloma are the top differential diagnostic considerations, correlate with serologic indicators in assessing for myeloma, and consider further technical imaging in the context of oncology workup. 2. 0.8 cm mildly enhancing lesion in the right ischial tuberosity, probably a hemangioma but technically nonspecific due to small size. Recommend attention on follow-up imaging. 3. Low level edema posteriorly in the left greater trochanter, likely due to adjacent gluteus medius tendinopathy. Electronically signed by: Ryan Salvage MD 03/20/2024 05:37 PM EST RP Workstation: [id number]   MR LUMBAR SPINE WO CONTRAST Result Date: 03/20/2024 EXAM: MRI LUMBAR SPINE 03/20/2024 05:09:16 PM TECHNIQUE: Multiplanar multisequence MRI of the lumbar spine was performed without the administration of intravenous contrast. COMPARISON: None available. CLINICAL HISTORY: Lumbar radiculopathy with right thigh pain. FINDINGS: BONES AND ALIGNMENT: Normal alignment. Normal vertebral body heights. Congenitally short pedicles in the lumbar spine. Focal inferior endplate irregularity with surrounding marrow edema along the inferior endplate of L5, most resembling an acute Schmorl node; however, given findings in the right acetabulum, the  possibility of a metastatic lesion along the inferior endplate of L5 is not totally ruled out. Adjacent type 1 and type 2 degenerative endplate findings noted. Background marrow signal is unremarkable unless otherwise stated. SPINAL CORD: Normal appearing conus medullaris terminates at L1-L2. SOFT TISSUES: No paraspinal mass. LIMITATIONS/ARTIFACTS: Motion artifact is present, reducing diagnostic sensitivity and specificity. DISC DESICCATION: Disc desiccation at L3-L4, L4-L5, and L1-L2. T12-L1: Unremarkable. L1-L2: Disc bulge. No impingement. L2-L3: Disc bulge and mild facet arthropathy. No impingement. L3-L4: Moderate central stenosis due to disc bulge and short pedicles. L4-L5: Moderate central stenosis with moderate right subarticular lateral recess stenosis and moderate right foraminal stenosis due to  disc bulge, right lateral recess and foraminal disc protrusion, facet arthropathy, and short pedicles. L5-S1: Borderline bilateral foraminal stenosis due to short pedicles, disc osteophyte complex, and facet arthropathy. IMPRESSION: 1. Moderate central stenosis with moderate right subarticular lateral recess stenosis and moderate right foraminal stenosis at L4-5 due to disc bulge, right lateral recess and foraminal disc protrusion, facet arthropathy, and short pedicles. 2. Moderate central stenosis at L3-4 due to disc bulge and short pedicles. 3. Focal inferior endplate irregularity with surrounding marrow edema along the inferior endplate of L5, most resembling an acute Schmorl's node; metastatic lesion cannot be completely excluded. Electronically signed by: Ryan Salvage MD 03/20/2024 05:28 PM EST RP Workstation: [id number]   DG Femur Min 2 Views Right Result Date: 03/20/2024 EXAM: 2 VIEW(S) XRAY OF THE FEMUR 03/20/2024 01:05:00 PM COMPARISON: 03/29/2010 CLINICAL HISTORY: hip pain/ h/o femur fracture with hardware FINDINGS: BONES AND JOINTS: Intramedullary rod and screws in place traversing healed  fracture deformity of mid femoral diaphysis, with single proximal and 2 distal interlocking screws. Demineralization of the upper medial acetabulum, underlying lytic lesion not excluded. This is further discussed in the hip/pelvis radiograph report. No joint dislocation. SOFT TISSUES: The soft tissues are unremarkable. IMPRESSION: 1. Demineralization of the upper medial right acetabulum with possible underlying lytic lesion; refer to today's hip/pelvis radiograph report for further discussion. 2. Intramedullary nail traversing healed mid femoral diaphyseal fracture, unchanged hardware alignment. Electronically signed by: Ryan Salvage MD 03/20/2024 02:46 PM EST RP Workstation: [id number]   DG Hip Unilat With Pelvis 2-3 Views Right Result Date: 03/20/2024 EXAM: 2 or more VIEW(S) XRAY OF THE HIP 03/20/2024 01:05:00 PM COMPARISON: 03/29/2010 CLINICAL HISTORY: hip pain/ h/o femur fracture with hardware FINDINGS: BONES AND JOINTS: Retrograde intramedullary nail in the right femur. Abnormal 2.1 cm lucency in the right upper medial acetabulum with demineralization of the cortical margin along the acetabulum concerning for a possible lytic lesion. Lytic osseous metastatic disease or myeloma not excluded. Dedicated MRI of the bony pelvis is recommended for more complete assessment. Mild degenerative chondral thinning in both hips. The hip joint is maintained. SOFT TISSUES: The soft tissues are unremarkable. IMPRESSION: 1. Abnormal 2.1 cm lucency in the right upper medial acetabulum with cortical demineralization, concerning for a lytic lesion; lytic osseous metastatic disease or myeloma not excluded, recommend dedicated MRI of the bony pelvis for further assessment. 2. Mild degenerative chondral thinning in both hips. Electronically signed by: Ryan Salvage MD 03/20/2024 02:43 PM EST RP Workstation: [id number]   DG Wrist Complete Right Result Date: 03/20/2024 EXAM: 3 OR MORE VIEW(S) XRAY OF THE WRIST  03/20/2024 12:16:00 PM COMPARISON: None available. CLINICAL HISTORY: pain swelling FINDINGS: BONES AND JOINTS: No acute fracture. No focal osseous lesion. No joint dislocation. SOFT TISSUES: Mild soft tissue swelling. IMPRESSION: 1. Mild soft tissue swelling. 2. No acute fracture or dislocation. Electronically signed by: Donnice Mania MD 03/20/2024 12:49 PM EST RP Workstation: [id number]    Results are pending, will review when available.  Previous records (including but not limited to H&P, progress notes, nursing notes, TOC management) were reviewed in assessment of this patient.  Labs: CBC: Recent Labs  Lab 03/20/24 1235 03/20/24 1900 03/22/24 0448 03/24/24 0417 03/25/24 0421 03/26/24 0403 03/27/24 0455  WBC 9.8 12.3* 12.1* 8.7 8.5 9.0 9.7  NEUTROABS 7.8* 9.9*  --   --   --   --   --   HGB 15.6 15.0 14.3 14.9 14.2 13.2 13.1  HCT 45.3 43.3 41.9 44.8 42.3 39.5 39.2  MCV 97.2  96.2 96.5 97.2 98.4 97.3 97.8  PLT 201 196 197 203 214 249 263   Basic Metabolic Panel: Recent Labs  Lab 03/22/24 0448 03/24/24 0417 03/25/24 0421 03/26/24 0403 03/27/24 0455  NA 135 137 137 136 135  K 4.3 4.6 4.5 4.3 4.5  CL 99 99 99 98 99  CO2 24 28 28 28 27   GLUCOSE 133* 150* 141* 136* 152*  BUN 17 16 15 15 15   CREATININE 1.02 1.04 1.12 1.09 1.04  CALCIUM  9.2 9.0 9.1 9.2 9.6   Liver Function Tests: Recent Labs  Lab 03/22/24 1442 03/24/24 0417 03/25/24 0421  AST 19 30 59*  ALT 8 26 50*  ALKPHOS 92 65 64  BILITOT 0.6 0.5 0.5  PROT 7.8 7.7 7.4  ALBUMIN 3.7 3.5 3.2*   CBG: No results for input(s): GLUCAP in the last 168 hours.  Scheduled Meds:  atorvastatin   40 mg Oral Daily   colchicine   0.6 mg Oral Daily   diclofenac  Sodium  2 g Topical QID   enoxaparin  (LOVENOX ) injection  40 mg Subcutaneous Q24H   losartan   50 mg Oral Daily   Continuous Infusions:   ceFAZolin  (ANCEF ) IV 2 g (03/27/24 0519)   PRN Meds:.acetaminophen , HYDROmorphone  (DILAUDID ) injection, oxyCODONE   Family  Communication: None at bedside  Disposition: Status is: Inpatient Remains inpatient appropriate because: Disposition     Time spent: 35 minutes  Length of inpatient stay: 6 days  Author: Carliss LELON Canales, DO 03/27/2024 10:47 AM  For on call review [email or web address].

## 2024-03-27 NOTE — TOC Progression Note (Signed)
 Transition of Care [facility]) - Progression Note    Patient Details  Name: Aaron Willis MRN: [id number] Date of Birth: November 03, 1960  [facility] of [facility]) CM/SW Contact  Heather DELENA Saltness, LCSW Phone Number: 03/27/2024, 9:45 AM  Clinical Narrative:    PT recommending short-term SNF rehab after discharge. Pt agreeable. CLC checklist and referral packet faxed to [facility] for review, at [phone]. Pending review. TOC will continue to follow.   Expected Discharge Plan: Skilled Nursing Facility Barriers to Discharge: SNF Pending bed offer, Insurance Authorization, Continued Medical Work up   Expected Discharge Plan and Services  SNF     Living arrangements for the past 2 months: Apartment                   Social Drivers of Health (SDOH) Interventions SDOH Screenings   Food Insecurity: No Food Insecurity (03/21/2024)  Housing: Low Risk  (03/21/2024)  Transportation Needs: No Transportation Needs (03/21/2024)  Utilities: Not At Risk (03/21/2024)  Tobacco Use: Unknown (03/25/2024)    Readmission Risk Interventions    03/21/2024   10:12 AM  Readmission Risk Prevention Plan  Post Dischage Appt Complete  Medication Screening Complete  Transportation Screening Complete    Signed: Heather Saltness, MSW, LCSW Clinical Social Worker Inpatient Care Management 03/27/2024 9:47 AM

## 2024-03-28 ENCOUNTER — Inpatient Hospital Stay (HOSPITAL_COMMUNITY)

## 2024-03-28 HISTORY — PX: IR BONE MARROW BIOPSY & ASPIRATION: IMG5727

## 2024-03-28 LAB — CBC WITH DIFFERENTIAL/PLATELET
Abs Immature Granulocytes: 0.18 K/uL — ABNORMAL HIGH (ref 0.00–0.07)
Basophils Absolute: 0 K/uL (ref 0.0–0.1)
Basophils Relative: 0 %
Eosinophils Absolute: 0.1 K/uL (ref 0.0–0.5)
Eosinophils Relative: 1 %
HCT: 42.6 % (ref 39.0–52.0)
Hemoglobin: 14 g/dL (ref 13.0–17.0)
Immature Granulocytes: 2 %
Lymphocytes Relative: 18 %
Lymphs Abs: 1.6 K/uL (ref 0.7–4.0)
MCH: 32.4 pg (ref 26.0–34.0)
MCHC: 32.9 g/dL (ref 30.0–36.0)
MCV: 98.6 fL (ref 80.0–100.0)
Monocytes Absolute: 0.8 K/uL (ref 0.1–1.0)
Monocytes Relative: 9 %
Neutro Abs: 6.2 K/uL (ref 1.7–7.7)
Neutrophils Relative %: 70 %
Platelets: 332 K/uL (ref 150–400)
RBC: 4.32 MIL/uL (ref 4.22–5.81)
RDW: 13.9 % (ref 11.5–15.5)
WBC: 8.9 K/uL (ref 4.0–10.5)
nRBC: 0 % (ref 0.0–0.2)

## 2024-03-28 LAB — SURGICAL PATHOLOGY

## 2024-03-28 MED ORDER — LIDOCAINE HCL (PF) 1 % IJ SOLN
30.0000 mL | Freq: Once | INTRAMUSCULAR | Status: DC
  Filled 2024-03-28: qty 30

## 2024-03-28 MED ORDER — MIDAZOLAM HCL 2 MG/2ML IJ SOLN
INTRAMUSCULAR | Status: AC
  Filled 2024-03-28: qty 2

## 2024-03-28 MED ORDER — PANTOPRAZOLE SODIUM 40 MG PO TBEC
40.0000 mg | DELAYED_RELEASE_TABLET | Freq: Two times a day (BID) | ORAL | Status: DC
  Administered 2024-03-28 – 2024-03-30 (×5): 40 mg via ORAL
  Filled 2024-03-28 (×5): qty 1

## 2024-03-28 MED ORDER — IBUPROFEN 400 MG PO TABS
600.0000 mg | ORAL_TABLET | Freq: Three times a day (TID) | ORAL | Status: DC
  Administered 2024-03-28 – 2024-03-30 (×7): 600 mg via ORAL
  Filled 2024-03-28 (×7): qty 1

## 2024-03-28 MED ORDER — MIDAZOLAM HCL (PF) 2 MG/2ML IJ SOLN
INTRAMUSCULAR | Status: AC | PRN
  Administered 2024-03-28 (×2): 1 mg via INTRAVENOUS

## 2024-03-28 MED ORDER — AMLODIPINE BESYLATE 5 MG PO TABS
5.0000 mg | ORAL_TABLET | Freq: Every day | ORAL | Status: DC
  Administered 2024-03-28: 5 mg via ORAL
  Filled 2024-03-28: qty 1

## 2024-03-28 MED ORDER — HYDRALAZINE HCL 25 MG PO TABS: 25.0000 mg | ORAL_TABLET | Freq: Four times a day (QID) | ORAL | Status: DC | PRN

## 2024-03-28 MED ORDER — LIDOCAINE HCL (PF) 1 % IJ SOLN
INTRAMUSCULAR | Status: AC
  Filled 2024-03-28: qty 30

## 2024-03-28 NOTE — Plan of Care (Signed)

## 2024-03-28 NOTE — Progress Notes (Signed)
 Mobility Specialist - Progress Note   03/28/24 1300  Mobility  Activity Ambulated with assistance  Level of Assistance Standby assist, set-up cues, supervision of patient - no hands on  Assistive Device Front wheel walker  Distance Ambulated (ft) 50 ft  Range of Motion/Exercises Active  Activity Response Tolerated well  Mobility Referral Yes  Mobility visit 1 Mobility  Mobility Specialist Start Time (ACUTE ONLY) 1302  Mobility Specialist Stop Time (ACUTE ONLY) 1310  Mobility Specialist Time Calculation (min) (ACUTE ONLY) 8 min   Pt received in chair and agreed to mobility, using RW pt ambulated two full laps around room with no physical help. Lightly gripping RW on RUE. Returned to chair with all needs met.  Cyndee Ada Mobility Specialist

## 2024-03-28 NOTE — Plan of Care (Signed)
   Problem: Education: Goal: Knowledge of General Education information will improve Description Including pain rating scale, medication(s)/side effects and non-pharmacologic comfort measures Outcome: Progressing   Problem: Health Behavior/Discharge Planning: Goal: Ability to manage health-related needs will improve Outcome: Progressing

## 2024-03-28 NOTE — Progress Notes (Signed)
 [facility] PROGRESS NOTE    Progress Note  Aaron Willis  FMW:[id number] DOB: Nov 23, 1960 DOA: 03/20/2024 PCP: [facility]     Brief Narrative:   Aaron Willis is an 63 y.o. male past medical history of essential hypertension presented to the ED with right hip pain and arm pain was found to have lytic lesion in the right acetabular.  Assessment/Plan:   Lytic bone lesion of hip in the right acetabular due to multiple myeloma MRI was done that showed a lytic expansive 4 x 3 x 2 cm mass in the right upper acetabulum with surrounding marrow edema cortical demineralization. Biopsy done on 05/28/2023 showed multiple myeloma. Appreciate oncology's assistance they also recommended a bone marrow biopsy hopefully will be done on 03/28/2024 Continue IV Dilaudid  will transition to scheduled ibuprofen for pain control. Started on Protonix twice a day. Hold ARB for now CT scan of the abdomen pelvis was unrevealing  Right wrist pain concerning for cellulitis: CT of the wrist showed no acute findings, no leukocytosis and no fever. Started initially on cefazolin on 03/03/2024, started on ibuprofen scheduled.  Protonix twice a day. 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 Showing improvement on current regimen plan to transition to Keflex upon discharge. Continue colchicine.  Moderate canal stenosis: Noted on MRI of L3-L4 L4-L5. No acute neurological deficits. PT evaluated the patient who recommended skilled nursing facility.  Physical debility/muscle weakness: Working with physical therapy anticipate will need skilled nursing facility placement. Continue pain control.  Essential hypertension: Hold ARB started on Norvasc hydralazine IV as needed.   DVT prophylaxis: Lovenox  Family Communication:none Status is: Inpatient Remains inpatient appropriate because: right hip found to have  lytic lesion.    Code Status:     Code Status Orders  (From admission, onward)           Start     Ordered   03/21/24 0148  Full code  Continuous       Question:  By:  Answer:  Consent: discussion documented in EHR   03/21/24 0148           Code Status History     This patient has a current code status but no historical code status.         IV Access:   Peripheral IV   Procedures and diagnostic studies:   CT BONE TROCAR/NEEDLE BIOPSY DEEP Result Date: 03/26/2024 INDICATION: Lytic right iliac acetabular bone lesion EXAM: CT CORE BIOPSY RIGHT ACETABULAR LYTIC BONE LESION TECHNIQUE: Multidetector CT imaging of the PELVIS was performed following the standard protocol without IV contrast. RADIATION DOSE REDUCTION: This exam was performed according to the departmental dose-optimization program which includes automated exposure control, adjustment of the mA and/or kV according to patient size and/or use of iterative reconstruction technique. MEDICATIONS: 1% lidocaine local ANESTHESIA/SEDATION: Moderate (conscious) sedation was employed during this procedure. A total of Versed 2.0 mg and Fentanyl 100 mcg was administered intravenously by the radiology nurse. Total intra-service moderate Sedation Time: 10 minutes. The patient's level of consciousness and vital signs were monitored continuously by radiology nursing throughout the procedure under my direct supervision. COMPLICATIONS: None immediate. PROCEDURE: Informed written consent was obtained from the patient after a thorough discussion of the procedural risks, benefits and alternatives. All questions were addressed. Maximal Sterile Barrier Technique was utilized including caps, mask, sterile gowns, sterile gloves, sterile drape, hand hygiene and skin antiseptic. A timeout was performed prior  to the initiation of the procedure. Previous imaging reviewed. Patient positioned supine. Noncontrast localization CT performed. The right  iliac acetabular lytic bone lesion was localized and marked for an anterior approach. Under sterile conditions and local anesthesia, the 17 gauge guide needle was advanced from an anterior oblique approach into the right acetabular lytic bone lesion. Needle position confirmed with CT. 18 gauge core biopsies obtained of the soft tissue lytic component. Samples were intact and non fragmented. These were placed in formalin. Needle removed. Postprocedure imaging demonstrates no hemorrhage or hematoma. Patient tolerated the biopsy well. IMPRESSION: Successful CT-guided right iliac acetabular lytic bone lesion 18 gauge core biopsy. Electronically Signed   By: CHRISTELLA.  Shick M.D.   On: 03/26/2024 15:08     Medical Consultants:   None.   Subjective:    Aaron Willis relates his pain is controlled, but his wrist continues to bother him.  Objective:    Vitals:   03/27/24 1420 03/27/24 1814 03/27/24 2117 03/28/24 0601  BP: (!) 156/81 (!) 131/90 (!) 171/93 (!) 157/95  Pulse: 92 95 86 89  Resp: 15  16 18   Temp: 98.3 F (36.8 C) 99.4 F (37.4 C) 98.6 F (37 C) 99 F (37.2 C)  TempSrc:  Oral Oral Oral  SpO2: 100% 100% 97% 97%  Weight:      Height:       SpO2: 97 % O2 Flow Rate (L/min): 2 L/min   Intake/Output Summary (Last 24 hours) at 03/28/2024 1001 Last data filed at 03/28/2024 0602 Gross per 24 hour  Intake 1020.07 ml  Output 1400 ml  Net -379.93 ml   Filed Weights   03/21/24 0330  Weight: 101.9 kg    Exam: General exam: In no acute distress. Respiratory system: Good air movement and clear to auscultation. Cardiovascular system: S1 & S2 heard, RRR. No JVD. Gastrointestinal system: Abdomen is nondistended, soft and nontender.  Extremities: right wrist is swollen, range of motion is decreased nontender to palpation. Skin: No rashes, lesions or ulcers Psychiatry: Judgement and insight appear normal. Mood & affect appropriate.    Data Reviewed:    Labs: Basic Metabolic  Panel: Recent Labs  Lab 03/22/24 0448 03/24/24 0417 03/25/24 0421 03/26/24 0403 03/27/24 0455  NA 135 137 137 136 135  K 4.3 4.6 4.5 4.3 4.5  CL 99 99 99 98 99  CO2 24 28 28 28 27   GLUCOSE 133* 150* 141* 136* 152*  BUN 17 16 15 15 15   CREATININE 1.02 1.04 1.12 1.09 1.04  CALCIUM  9.2 9.0 9.1 9.2 9.6   GFR Estimated Creatinine Clearance: 82.3 mL/min (by C-G formula based on SCr of 1.04 mg/dL). Liver Function Tests: Recent Labs  Lab 03/22/24 1442 03/24/24 0417 03/25/24 0421  AST 19 30 59*  ALT 8 26 50*  ALKPHOS 92 65 64  BILITOT 0.6 0.5 0.5  PROT 7.8 7.7 7.4  ALBUMIN 3.7 3.5 3.2*   No results for input(s): LIPASE, AMYLASE in the last 168 hours. No results for input(s): AMMONIA in the last 168 hours. Coagulation profile Recent Labs  Lab 03/23/24 1238  INR 1.0   COVID-19 Labs  No results for input(s): DDIMER, FERRITIN, LDH, CRP in the last 72 hours.  Lab Results  Component Value Date   SARSCOV2NAA NEGATIVE 04/13/2022    CBC: Recent Labs  Lab 03/22/24 0448 03/24/24 0417 03/25/24 0421 03/26/24 0403 03/27/24 0455  WBC 12.1* 8.7 8.5 9.0 9.7  HGB 14.3 14.9 14.2 13.2 13.1  HCT 41.9 44.8 42.3  39.5 39.2  MCV 96.5 97.2 98.4 97.3 97.8  PLT 197 203 214 249 263   Cardiac Enzymes: No results for input(s): CKTOTAL, CKMB, CKMBINDEX, TROPONINI in the last 168 hours. BNP (last 3 results) No results for input(s): PROBNP in the last 8760 hours. CBG: No results for input(s): GLUCAP in the last 168 hours. D-Dimer: No results for input(s): DDIMER in the last 72 hours. Hgb A1c: No results for input(s): HGBA1C in the last 72 hours. Lipid Profile: No results for input(s): CHOL, HDL, LDLCALC, TRIG, CHOLHDL, LDLDIRECT in the last 72 hours. Thyroid function studies: No results for input(s): TSH, T4TOTAL, T3FREE, THYROIDAB in the last 72 hours.  Invalid input(s): FREET3 Anemia work up: No results for input(s):  VITAMINB12, FOLATE, FERRITIN, TIBC, IRON, RETICCTPCT in the last 72 hours. Sepsis Labs: Recent Labs  Lab 03/24/24 0417 03/25/24 0421 03/26/24 0403 03/27/24 0455  WBC 8.7 8.5 9.0 9.7   Microbiology Recent Results (from the past 240 hours)  Culture, blood (single)     Status: None   Collection Time: 03/21/24  1:39 AM   Specimen: BLOOD  Result Value Ref Range Status   Specimen Description   Final    BLOOD SITE NOT SPECIFIED Performed at [facility], [address], [place], [place] [address]    Special Requests   Final    BOTTLES DRAWN AEROBIC AND ANAEROBIC Blood Culture results may not be optimal due to an inadequate volume of blood received in culture bottles Performed at [facility], [address] [place], [place] [address]    Culture   Final    NO GROWTH 5 DAYS Performed at [facility], [address] [place], [place] [address]    Report Status 03/26/2024 FINAL  Final     Medications:    atorvastatin   40 mg Oral Daily   colchicine  0.6 mg Oral Daily   diclofenac Sodium  2 g Topical QID   enoxaparin  (LOVENOX ) injection  40 mg Subcutaneous Q24H   losartan   50 mg Oral Daily   Continuous Infusions:   ceFAZolin (ANCEF) IV 2 g (03/28/24 0512)      LOS: 7 days   Erle Odell Castor  [facility]  03/28/2024, 10:01 AM

## 2024-03-28 NOTE — Progress Notes (Signed)
 Patient ID: Aaron Willis, male   DOB: 1961-01-31, 64 y.o.   MRN: [id number] Request received this am for image guided bone marrow biopsy on pt. He is undergoing workup for myeloma.  He is known to IR team from recent right iliac acetabular lytic lesion bx on 12/1.Risks and benefits of procedure was discussed with the patient  including, but not limited to bleeding, infection, damage to adjacent structures or low yield requiring additional tests.  All of the questions were answered and there is agreement to proceed.  Consent signed and in chart. Pt ate breakfast this am however he wishes to proceed with bx today via local anesthetic.

## 2024-03-28 NOTE — Procedures (Signed)
Interventional Radiology Procedure Note  Procedure: CT guided aspirate and core biopsy of LEFT iliac bone Complications: None Recommendations: - Bedrest supine x 1 hrs - Hydrocodone PRN  Pain - Follow biopsy results  Signed,  Criselda Peaches, MD

## 2024-03-29 ENCOUNTER — Other Ambulatory Visit: Payer: Self-pay

## 2024-03-29 LAB — BETA 2 MICROGLOBULIN, SERUM: Beta-2 Microglobulin: 2.2 mg/L (ref 0.6–2.4)

## 2024-03-29 MED ORDER — AMLODIPINE BESYLATE 10 MG PO TABS
10.0000 mg | ORAL_TABLET | Freq: Every day | ORAL | Status: DC
  Administered 2024-03-29 – 2024-03-30 (×2): 10 mg via ORAL
  Filled 2024-03-29 (×2): qty 1

## 2024-03-29 MED ORDER — CEPHALEXIN 500 MG PO CAPS
500.0000 mg | ORAL_CAPSULE | Freq: Three times a day (TID) | ORAL | Status: AC
  Administered 2024-03-29: 500 mg via ORAL
  Filled 2024-03-29: qty 1

## 2024-03-29 NOTE — Progress Notes (Signed)
 Aaron Willis   DOB:December 13, 1960   FM#:[id number]   RDW#:[id number]  Medical oncology follow-up note  Subjective: Patient underwent a bone marrow biopsy yesterday.  He has a right wrist pain and edema has improved, eager to go home.   Objective:  Vitals:   03/29/24 1335 03/29/24 2034  BP: (!) 162/80 (!) 146/79  Pulse: 77 84  Resp: 16 16  Temp: 98 F (36.7 C) 98.2 F (36.8 C)  SpO2: 98% 100%    Body mass index is 36.26 kg/m.  Intake/Output Summary (Last 24 hours) at 03/29/2024 2337 Last data filed at 03/29/2024 2200 Gross per 24 hour  Intake 1406.04 ml  Output --  Net 1406.04 ml     Sclerae unicteric  Oropharynx clear  No peripheral adenopathy  Lungs clear -- no rales or rhonchi  Heart regular rate and rhythm  Abdomen benign  MSK showed improved edema of right forearm, wrist and hand  Neuro nonfocal   CBG (last 3)  No results for input(s): GLUCAP in the last 72 hours.   Labs:  Lab Results  Component Value Date   WBC 8.9 03/28/2024   HGB 14.0 03/28/2024   HCT 42.6 03/28/2024   MCV 98.6 03/28/2024   PLT 332 03/28/2024   NEUTROABS 6.2 03/28/2024   Urine Studies No results for input(s): UHGB, CRYS in the last 72 hours.  Invalid input(s): UACOL, UAPR, USPG, UPH, UTP, UGL, UKET, UBIL, UNIT, UROB, ULEU, UEPI, UWBC, URBC, UBAC, CAST, [place], [place]  Basic Metabolic Panel: Recent Labs  Lab 03/24/24 0417 03/25/24 0421 03/26/24 0403 03/27/24 0455  NA 137 137 136 135  K 4.6 4.5 4.3 4.5  CL 99 99 98 99  CO2 28 28 28 27   GLUCOSE 150* 141* 136* 152*  BUN 16 15 15 15   CREATININE 1.04 1.12 1.09 1.04  CALCIUM  9.0 9.1 9.2 9.6   GFR Estimated Creatinine Clearance: 82.3 mL/min (by C-G formula based on SCr of 1.04 mg/dL). Liver Function Tests: Recent Labs  Lab 03/24/24 0417 03/25/24 0421  AST 30 59*  ALT 26 50*  ALKPHOS 65 64  BILITOT 0.5 0.5  PROT 7.7 7.4  ALBUMIN 3.5 3.2*   No results for input(s): LIPASE,  AMYLASE in the last 168 hours. No results for input(s): AMMONIA in the last 168 hours. Coagulation profile Recent Labs  Lab 03/23/24 1238  INR 1.0    CBC: Recent Labs  Lab 03/24/24 0417 03/25/24 0421 03/26/24 0403 03/27/24 0455 03/28/24 1000  WBC 8.7 8.5 9.0 9.7 8.9  NEUTROABS  --   --   --   --  6.2  HGB 14.9 14.2 13.2 13.1 14.0  HCT 44.8 42.3 39.5 39.2 42.6  MCV 97.2 98.4 97.3 97.8 98.6  PLT 203 214 249 263 332   Cardiac Enzymes: No results for input(s): CKTOTAL, CKMB, CKMBINDEX, TROPONINI in the last 168 hours. BNP: Invalid input(s): POCBNP CBG: No results for input(s): GLUCAP in the last 168 hours. D-Dimer No results for input(s): DDIMER in the last 72 hours. Hgb A1c No results for input(s): HGBA1C in the last 72 hours. Lipid Profile No results for input(s): CHOL, HDL, LDLCALC, TRIG, CHOLHDL, LDLDIRECT in the last 72 hours. Thyroid function studies No results for input(s): TSH, T4TOTAL, T3FREE, THYROIDAB in the last 72 hours.  Invalid input(s): FREET3 Anemia work up No results for input(s): VITAMINB12, FOLATE, FERRITIN, TIBC, IRON, RETICCTPCT in the last 72 hours. Microbiology Recent Results (from the past 240 hours)  Culture, blood (single)  Status: None   Collection Time: 03/21/24  1:39 AM   Specimen: BLOOD  Result Value Ref Range Status   Specimen Description   Final    BLOOD SITE NOT SPECIFIED Performed at [facility], [address], [place], [place] [address]    Special Requests   Final    BOTTLES DRAWN AEROBIC AND ANAEROBIC Blood Culture results may not be optimal due to an inadequate volume of blood received in culture bottles Performed at [facility], [address], [place], [place] [address]    Culture   Final    NO GROWTH 5 DAYS Performed at [facility], [address], [place], [place] [address]    Report Status 03/26/2024 FINAL  Final       Studies:  IR BONE MARROW BIOPSY & ASPIRATION Result Date: 03/28/2024 INDICATION: 63 year old male with newly diagnosed lytic bone lesions concerning for possible myeloma. He presents image guided bone marrow biopsy. EXAM: FLUOROSCOPY GUIDED BONE MARROW ASPIRATION AND CORE BIOPSY Interventional Radiologist:  Wilkie LOIS Lent, MD MEDICATIONS: None. ANESTHESIA/SEDATION: 2 mg Versed administered intravenously for anxiolysis. This does not constitute moderate sedation. FLUOROSCOPY: Radiation exposure index: 9 mGy, air kerma COMPLICATIONS: None immediate. Estimated blood loss: <25 mL PROCEDURE: Informed written consent was obtained from the patient after a thorough discussion of the procedural risks, benefits and alternatives. All questions were addressed. Maximal Sterile Barrier Technique was utilized including caps, mask, sterile gowns, sterile gloves, sterile drape, hand hygiene and skin antiseptic. A timeout was performed prior to the initiation of the procedure. The patient was positioned prone and localization fluoroscopy was performed of the pelvis to demonstrate the iliac marrow spaces. Maximal barrier sterile technique utilized including caps, mask, sterile gowns, sterile gloves, large sterile drape, hand hygiene, and betadine prep. Under sterile conditions and local anesthesia, an 11 gauge coaxial bone biopsy needle was advanced into the left iliac marrow space. Needle position was confirmed with imaging. Initially, bone marrow aspiration was performed. Next, the 11 gauge outer cannula was utilized to obtain a left iliac bone marrow core biopsy. Needle was removed. Hemostasis was obtained with compression. The patient tolerated the procedure well. Samples were prepared with the cytotechnologist. IMPRESSION: Successful image guided left iliac bone marrow aspiration and core biopsy. Electronically Signed   By: Wilkie Lent M.D.   On: 03/28/2024 11:35    Assessment: 63 y.o. 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  Bone marrow biopsy results still pending.  I also plan to order PET scan after discharge, to see if he has multiple myeloma, versus plasmacytoma. - Okay to discharge from my standpoint.  I plan to see him back after PET scan   Onita Mattock, MD 03/29/2024

## 2024-03-29 NOTE — TOC Progression Note (Addendum)
 Transition of Care [facility]) - Progression Note    Patient Details  Name: Aaron Willis MRN: [id number] Date of Birth: 1961-03-20  Transition of [facility] CM/SW Contact  Alfonse JONELLE Rex, RN Phone Number: 03/29/2024, 11:12 AM  Clinical Narrative:   Call [name], at [phone] ext. [phone], to check on status of SNF request, no answer, vm left with NCM name and phone number requesting a call back.   -4:00pm Teams chat received from PT  [facility] improved mobility. Amb in hallway several times today. MUCH improved pain. No longer needing OXY. Changing rec to HOME NO equipment and Pt feels he does NOT need HH PT. He wants to know if he can go home TODAY, await response from attending     Expected Discharge Plan: Skilled Nursing Facility Barriers to Discharge: SNF Pending bed offer, Insurance Authorization, Continued Medical Work up               Expected Discharge Plan and Services       Living arrangements for the past 2 months: Apartment                                       Social Drivers of Health (SDOH) Interventions SDOH Screenings   Food Insecurity: No Food Insecurity (03/21/2024)  Housing: Low Risk  (03/21/2024)  Transportation Needs: No Transportation Needs (03/21/2024)  Utilities: Not At Risk (03/21/2024)  Tobacco Use: Unknown (03/25/2024)    Readmission Risk Interventions    03/21/2024   10:12 AM  Readmission Risk Prevention Plan  Post Dischage Appt Complete  Medication Screening Complete  Transportation Screening Complete

## 2024-03-29 NOTE — Plan of Care (Signed)
  Problem: Activity: Goal: Risk for activity intolerance will decrease Outcome: Adequate for Discharge   Problem: Nutrition: Goal: Adequate nutrition will be maintained Outcome: Adequate for Discharge   Problem: Safety: Goal: Ability to remain free from injury will improve Outcome: Progressing

## 2024-03-29 NOTE — Progress Notes (Signed)
 [facility] PROGRESS NOTE    Progress Note  CORT DRAGOO  FMW:[id number] DOB: 1961-01-25 DOA: 03/20/2024 PCP: [facility]     Brief Narrative:   Aaron Willis is an 63 y.o. male past medical history of essential hypertension presented to the ED with right hip pain and arm pain was found to have lytic lesion in the right acetabular. MRI was done that showed a lytic expansive 4 x 3 x 2 cm mass in the right upper acetabulum with surrounding marrow edema cortical demineralization.  Assessment/Plan:   Lytic bone lesion of hip in the right acetabular due to multiple myeloma Biopsy done on 05/28/2023 showed multiple myeloma.   Bone marrow has been biopsy has been done on 02/27/2024. Continue ibuprofen for pain, Protonix for prophylaxis. Hold ARB for now CT scan of the abdomen pelvis was unrevealing. Awaiting oncology further recommendations.  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 Started on ibuprofen he relates his pain is significantly better. Continue Protonix twice a day change Ancef to Keflex  Moderate canal stenosis: Noted on MRI of L3-L4 L4-L5. No acute neurological deficits. PT evaluated the patient who recommended skilled nursing facility. Patient was in reading in the hall you probably go home with home health.  Physical debility/muscle weakness: Working with physical therapy anticipate will need skilled nursing facility placement. Continue pain control.  Essential hypertension: Hold ARB started on Norvasc hydralazine IV as needed.   DVT prophylaxis: Lovenox  Family Communication:none Status is: Inpatient Remains inpatient appropriate because: right hip found to have lytic lesion.    Code Status:     Code Status Orders  (From admission, onward)           Start     Ordered   03/21/24  0148  Full code  Continuous       Question:  By:  Answer:  Consent: discussion documented in EHR   03/21/24 0148           Code Status History     This patient has a current code status but no historical code status.         IV Access:   Peripheral IV   Procedures and diagnostic studies:   IR BONE MARROW BIOPSY & ASPIRATION Result Date: 03/28/2024 INDICATION: 64 year old male with newly diagnosed lytic bone lesions concerning for possible myeloma. He presents image guided bone marrow biopsy. EXAM: FLUOROSCOPY GUIDED BONE MARROW ASPIRATION AND CORE BIOPSY Interventional Radiologist:  Wilkie LOIS Lent, MD MEDICATIONS: None. ANESTHESIA/SEDATION: 2 mg Versed administered intravenously for anxiolysis. This does not constitute moderate sedation. FLUOROSCOPY: Radiation exposure index: 9 mGy, air kerma COMPLICATIONS: None immediate. Estimated blood loss: <25 mL PROCEDURE: Informed written consent was obtained from the patient after a thorough discussion of the procedural risks, benefits and alternatives. All questions were addressed. Maximal Sterile Barrier Technique was utilized including caps, mask, sterile gowns, sterile gloves, sterile drape, hand hygiene and skin antiseptic. A timeout was performed prior to the initiation of the procedure. The patient was positioned prone and localization fluoroscopy was performed of the pelvis to demonstrate the iliac marrow spaces. Maximal barrier sterile technique utilized including caps, mask, sterile gowns, sterile gloves, large sterile drape, hand hygiene, and betadine prep. Under sterile conditions and local anesthesia, an 11 gauge coaxial bone biopsy needle was advanced into the left iliac marrow space. Needle position was confirmed with imaging. Initially, bone  marrow aspiration was performed. Next, the 11 gauge outer cannula was utilized to obtain a left iliac bone marrow core biopsy. Needle was removed. Hemostasis was obtained with compression.  The patient tolerated the procedure well. Samples were prepared with the cytotechnologist. IMPRESSION: Successful image guided left iliac bone marrow aspiration and core biopsy. Electronically Signed   By: Wilkie Lent M.D.   On: 03/28/2024 11:35     Medical Consultants:   None.   Subjective:    [name] relates his wrist pain is better controlled.  No pain more mobile.  Objective:    Vitals:   03/28/24 1202 03/28/24 1320 03/28/24 2037 03/29/24 0543  BP: (!) 167/90 (!) 142/89 (!) 158/78 (!) 151/77  Pulse: 92 94 78 67  Resp: 18 16 18 18   Temp: 98.7 F (37.1 C) (!) 97.3 F (36.3 C) 98.3 F (36.8 C) 98.2 F (36.8 C)  TempSrc: Oral Oral Oral Oral  SpO2: 100% 94% 97% 95%  Weight:      Height:       SpO2: 95 % O2 Flow Rate (L/min): 2 L/min   Intake/Output Summary (Last 24 hours) at 03/29/2024 0803 Last data filed at 03/29/2024 9376 Gross per 24 hour  Intake 2046.04 ml  Output 350 ml  Net 1696.04 ml   Filed Weights   03/21/24 0330  Weight: 101.9 kg    Exam: General exam: In no acute distress. Respiratory system: Good air movement and clear to auscultation. Cardiovascular system: S1 & S2 heard, RRR. No JVD. Gastrointestinal system: Abdomen is nondistended, soft and nontender.  Extremities: No pedal edema. Skin: No rashes, lesions or ulcers Psychiatry: Judgement and insight appear normal. Mood & affect appropriate. Data Reviewed:    Labs: Basic Metabolic Panel: Recent Labs  Lab 03/24/24 0417 03/25/24 0421 03/26/24 0403 03/27/24 0455  NA 137 137 136 135  K 4.6 4.5 4.3 4.5  CL 99 99 98 99  CO2 28 28 28 27   GLUCOSE 150* 141* 136* 152*  BUN 16 15 15 15   CREATININE 1.04 1.12 1.09 1.04  CALCIUM  9.0 9.1 9.2 9.6   GFR Estimated Creatinine Clearance: 82.3 mL/min (by C-G formula based on SCr of 1.04 mg/dL). Liver Function Tests: Recent Labs  Lab 03/22/24 1442 03/24/24 0417 03/25/24 0421  AST 19 30 59*  ALT 8 26 50*  ALKPHOS 92 65 64   BILITOT 0.6 0.5 0.5  PROT 7.8 7.7 7.4  ALBUMIN 3.7 3.5 3.2*   No results for input(s): LIPASE, AMYLASE in the last 168 hours. No results for input(s): AMMONIA in the last 168 hours. Coagulation profile Recent Labs  Lab 03/23/24 1238  INR 1.0   COVID-19 Labs  No results for input(s): DDIMER, FERRITIN, LDH, CRP in the last 72 hours.  Lab Results  Component Value Date   SARSCOV2NAA NEGATIVE 04/13/2022    CBC: Recent Labs  Lab 03/24/24 0417 03/25/24 0421 03/26/24 0403 03/27/24 0455 03/28/24 1000  WBC 8.7 8.5 9.0 9.7 8.9  NEUTROABS  --   --   --   --  6.2  HGB 14.9 14.2 13.2 13.1 14.0  HCT 44.8 42.3 39.5 39.2 42.6  MCV 97.2 98.4 97.3 97.8 98.6  PLT 203 214 249 263 332   Cardiac Enzymes: No results for input(s): CKTOTAL, CKMB, CKMBINDEX, TROPONINI in the last 168 hours. BNP (last 3 results) No results for input(s): PROBNP in the last 8760 hours. CBG: No results for input(s): GLUCAP in the last 168 hours. D-Dimer: No results for input(s):  DDIMER in the last 72 hours. Hgb A1c: No results for input(s): HGBA1C in the last 72 hours. Lipid Profile: No results for input(s): CHOL, HDL, LDLCALC, TRIG, CHOLHDL, LDLDIRECT in the last 72 hours. Thyroid function studies: No results for input(s): TSH, T4TOTAL, T3FREE, THYROIDAB in the last 72 hours.  Invalid input(s): FREET3 Anemia work up: No results for input(s): VITAMINB12, FOLATE, FERRITIN, TIBC, IRON, RETICCTPCT in the last 72 hours. Sepsis Labs: Recent Labs  Lab 03/25/24 0421 03/26/24 0403 03/27/24 0455 03/28/24 1000  WBC 8.5 9.0 9.7 8.9   Microbiology Recent Results (from the past 240 hours)  Culture, blood (single)     Status: None   Collection Time: 03/21/24  1:39 AM   Specimen: BLOOD  Result Value Ref Range Status   Specimen Description   Final    BLOOD SITE NOT SPECIFIED Performed at [facility], [address]  [place], [place] [address]    Special Requests   Final    BOTTLES DRAWN AEROBIC AND ANAEROBIC Blood Culture results may not be optimal due to an inadequate volume of blood received in culture bottles Performed at [facility], [address], [place], [place] [address]    Culture   Final    NO GROWTH 5 DAYS Performed at [facility], [address], [place], [place] [address]    Report Status 03/26/2024 FINAL  Final     Medications:    amLODipine  5 mg Oral Daily   atorvastatin   40 mg Oral Daily   colchicine  0.6 mg Oral Daily   diclofenac Sodium  2 g Topical QID   enoxaparin  (LOVENOX ) injection  40 mg Subcutaneous Q24H   ibuprofen  600 mg Oral TID   lidocaine (PF)  30 mL Intradermal Once   pantoprazole  40 mg Oral BID   Continuous Infusions:   ceFAZolin (ANCEF) IV 2 g (03/29/24 0515)      LOS: 8 days   Erle Odell Castor  [facility]  03/29/2024, 8:03 AM

## 2024-03-29 NOTE — Progress Notes (Signed)
 Physical Therapy Treatment Patient Details Name: Aaron Willis MRN: [id number] DOB: [birth year] Today's Date: 03/29/2024   History of Present Illness 63 yo male presents to therapy following hospital on 03/20/2024 due to R wrist and hip pain, pt wound to have lytic bone lesion in the R acetabulum. Pt PMH includes but is not limited to: DM II, HTN, R femur fx and L knee surgery.    PT Comments  AxO x 3 pleasant and feeling much better.  Reported [facility] Nursing staff, Pt has been amb up and down the hallway several times today without assist and without an AD.  Pain much improved in both his R hip and R wrist.  Has NOT taken any OXY in 2 days and pain is controlled using Ibuprofen.  General Gait Details: Pt able to amb full length of hallway without assistance and wiithout need for any AD.  R UE in sling for comfort.  No overt LOB.  Good safety awareness.  Eager to go home.  Will update LPT Pt's progress and that ST Rehab at SNF is no longer needed.  Pt also declines need for [facility] PT.     If plan is discharge home, recommend the following: Assist for transportation;Assistance with cooking/housework   Can travel by private vehicle     Yes  Equipment Recommendations       Recommendations for Other Services       Precautions / Restrictions       Mobility  Bed Mobility               General bed mobility comments: OOB in recliner    Transfers Overall transfer level: Modified independent                 General transfer comment: Pt self able to rise from recliner with good safety awareness and cognition.    Ambulation/Gait Ambulation/Gait assistance: Modified independent (Device/Increase time) Gait Distance (Feet): 250 Feet Assistive device: None Gait Pattern/deviations: Step-through pattern Gait velocity: WNL     General Gait Details: Pt able to amb full length of hallway without assistance and wiithout need for any AD.  R UE in sling for comfort.  No overt  LOB.  Good safety awareness.  Eager to go home.   Stairs             Wheelchair Mobility     Tilt Bed    Modified Rankin (Stroke Patients Only)       Balance                                            Communication Communication Communication: No apparent difficulties  Cognition   Behavior During Therapy: WFL for tasks assessed/performed   PT - Cognitive impairments: No apparent impairments                       PT - Cognition Comments: AxO x 3 pleasant and feeling much better.  Reported [facility] Nursing staff, Pt has been amb up and down the hallway several times today without assist and without an AD.  Pain much improved in both his R hip and R wrist.  Has NOT taken any OXY in 2 days and pain is controlled using Ibuprofen. Following commands: Intact      Cueing Cueing Techniques: Verbal cues  Exercises      General  Comments        Pertinent Vitals/Pain      Home Living                          Prior Function            PT Goals (current goals can now be found in the care plan section) Progress towards PT goals: Progressing toward goals    Frequency    Min 3X/week      PT Plan      Co-evaluation              AM-PAC PT 6 Clicks Mobility   Outcome Measure  Help needed turning from your back to your side while in a flat bed without using bedrails?: None Help needed moving from lying on your back to sitting on the side of a flat bed without using bedrails?: None Help needed moving to and from a bed to a chair (including a wheelchair)?: None Help needed standing up from a chair using your arms (e.g., wheelchair or bedside chair)?: None Help needed to walk in hospital room?: None Help needed climbing 3-5 steps with a railing? : A Little 6 Click Score: 23    End of Session Equipment Utilized During Treatment: Gait belt Activity Tolerance: Patient tolerated treatment well Patient left: in  chair;with call bell/phone within reach Nurse Communication: Mobility status PT Visit Diagnosis: Unsteadiness on feet (R26.81);Other abnormalities of gait and mobility (R26.89);Muscle weakness (generalized) (M62.81);Pain;Difficulty in walking, not elsewhere classified (R26.2)     Time: 8477-8465 PT Time Calculation (min) (ACUTE ONLY): 12 min  Charges:    $Gait Training: 8-22 mins PT General Charges $$ ACUTE PT VISIT: 1 Visit                     Katheryn Leap  PTA Acute  Rehabilitation Services Office M-F          [phone]

## 2024-03-30 ENCOUNTER — Other Ambulatory Visit (HOSPITAL_COMMUNITY): Payer: Self-pay

## 2024-03-30 ENCOUNTER — Other Ambulatory Visit: Payer: Self-pay | Admitting: Hematology

## 2024-03-30 ENCOUNTER — Other Ambulatory Visit: Payer: Self-pay

## 2024-03-30 DIAGNOSIS — C9000 Multiple myeloma not having achieved remission: Secondary | ICD-10-CM

## 2024-03-30 LAB — SURGICAL PATHOLOGY

## 2024-03-30 MED ORDER — IBUPROFEN 600 MG PO TABS: 600.0000 mg | ORAL_TABLET | Freq: Three times a day (TID) | ORAL | Status: AC | PRN

## 2024-03-30 MED ORDER — CEPHALEXIN 500 MG PO CAPS: 500.0000 mg | ORAL_CAPSULE | Freq: Three times a day (TID) | ORAL | 0 refills | Status: AC

## 2024-03-30 MED ORDER — PANTOPRAZOLE SODIUM 40 MG PO TBEC: 40.0000 mg | DELAYED_RELEASE_TABLET | Freq: Every day | ORAL | 0 refills | Status: DC

## 2024-03-30 NOTE — TOC Transition Note (Signed)
 Transition of Care [facility]) - Discharge Note   Patient Details  Name: Aaron Willis MRN: [id number] Date of Birth: October 28, 1960  [facility] CM/SW Contact:  Alfonse JONELLE Rex, RN Phone Number: 03/30/2024, 11:01 AM   Clinical Narrative:   DC to home, self care. No further INPT CM needs identified at this time.     Final next level of care: Home/Self Care Barriers to Discharge: Barriers Resolved   Patient Goals and CMS Choice Patient states their goals for this hospitalization and ongoing recovery are:: return home          Discharge Placement                       Discharge Plan and Services Additional resources added to the After Visit Summary for                                       Social Drivers of Health (SDOH) Interventions SDOH Screenings   Food Insecurity: No Food Insecurity (03/21/2024)  Housing: Low Risk  (03/21/2024)  Transportation Needs: No Transportation Needs (03/21/2024)  Utilities: Not At Risk (03/21/2024)  Tobacco Use: Unknown (03/25/2024)     Readmission Risk Interventions    03/21/2024   10:12 AM  Readmission Risk Prevention Plan  Post Dischage Appt Complete  Medication Screening Complete  Transportation Screening Complete

## 2024-03-30 NOTE — Plan of Care (Signed)
   Problem: Education: Goal: Knowledge of General Education information will improve Description Including pain rating scale, medication(s)/side effects and non-pharmacologic comfort measures Outcome: Progressing   Problem: Health Behavior/Discharge Planning: Goal: Ability to manage health-related needs will improve Outcome: Progressing

## 2024-03-30 NOTE — Discharge Summary (Signed)
 Physician Discharge Summary  Aaron Willis FMW:[id number] DOB: 1960-07-18 DOA: 03/20/2024  PCP: [facility]  Admit date: 03/20/2024 Discharge date: 03/30/2024  Admitted From: Home Disposition:  Home  Recommendations for Outpatient Follow-up:  Follow up with PCP in 1-2 weeks Please obtain BMP/CBC in one week   Home Health:No Equipment/Devices:None  Discharge Condition:Stable CODE STATUS:Full Diet recommendation: Heart Healthy   Brief/Interim Summary:  64 y.o. male past medical history of essential hypertension presented to the ED with right hip pain and arm pain was found to have lytic lesion in the right acetabular. MRI was done that showed a lytic expansive 4 x 3 x 2 cm mass in the right upper acetabulum with surrounding marrow edema cortical demineralization.   Discharge Diagnoses:  Principal Problem:   Lytic bone lesion of hip Active Problems:   Acute pain of right wrist   Intractable pain   Cellulitis of right wrist Lytic bone lesion of the right hip acetabular region due to multiple myeloma: Pulm biopsy done on 03/26/2024 that showed multiple myeloma. Bone marrow done in 03/28/2024 Oncology was consulted, who recommended a CT scan of the abdomen pelvis which was unrevealing. She will follow-up with oncology as an outpatient start chemotherapy as an outpatient.  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  He was started empirically on antibiotics. He remained afebrile with no leukocytosis.  He will finish his empiric antibiotic treatment as an outpatient. He will continue ibuprofen  as needed. Continue Protonix  for 7 days for GI prophylaxis.  Moderate canal stenosis: Noted on MRI L3-L4 for L5 no focal neurological deficits. PT evaluated the patient recommended home health PT.  Physical debility/muscle weakness: Question of is  limiting ambulation was likely due to gout, during my encounter he did not complain about any hip knee or ankle pain. We did he did have gout or pseudogout who was treated empirically with ibuprofen . Will see ibuprofen  was started he was able to ambulate without any difficulties, and his wrist pain resolved.  Essential hypertension: No change made to his medication continue current regimen as an outpatient.    Discharge Instructions  Discharge Instructions     Diet - low sodium heart healthy   Complete by: As directed    Increase activity slowly   Complete by: As directed    No wound care   Complete by: As directed       Allergies as of 03/30/2024   No Active Allergies      Medication List     STOP taking these medications    ondansetron  8 MG disintegrating tablet Commonly known as: ZOFRAN -ODT       TAKE these medications    acetaminophen  500 MG tablet Commonly known as: TYLENOL  Take 500 mg by mouth every 6 (six) hours as needed.   amoxicillin -clavulanate 875-125 MG tablet Commonly known as: AUGMENTIN  Take 1 tablet by mouth every 12 (twelve) hours.   atorvastatin  40 MG tablet Commonly known as: LIPITOR Take 40 mg by mouth daily.   Empagliflozin-metFORMIN HCl ER 25-1000 MG Tb24 Take 1 tablet by mouth daily.   fluticasone  50 MCG/ACT nasal spray Commonly known as: FLONASE  Place 2 sprays into both nostrils daily. What changed:  when to take this reasons to take this   ibuprofen  600 MG tablet Commonly known as: ADVIL  Take 1 tablet (600 mg total) by mouth every 8 (eight) hours as needed.   losartan  100  MG tablet Commonly known as: COZAAR  Take 50 mg by mouth daily.   pantoprazole  40 MG tablet Commonly known as: PROTONIX  Take 1 tablet (40 mg total) by mouth daily.        No Active Allergies  Consultations: Oncology Interventional radiology   Procedures/Studies: IR BONE MARROW BIOPSY & ASPIRATION Result Date: 03/28/2024 INDICATION:  63 year old male with newly diagnosed lytic bone lesions concerning for possible myeloma. He presents image guided bone marrow biopsy. EXAM: FLUOROSCOPY GUIDED BONE MARROW ASPIRATION AND CORE BIOPSY Interventional Radiologist:  Wilkie LOIS Lent, MD MEDICATIONS: None. ANESTHESIA/SEDATION: 2 mg Versed  administered intravenously for anxiolysis. This does not constitute moderate sedation. FLUOROSCOPY: Radiation exposure index: 9 mGy, air kerma COMPLICATIONS: None immediate. Estimated blood loss: <25 mL PROCEDURE: Informed written consent was obtained from the patient after a thorough discussion of the procedural risks, benefits and alternatives. All questions were addressed. Maximal Sterile Barrier Technique was utilized including caps, mask, sterile gowns, sterile gloves, sterile drape, hand hygiene and skin antiseptic. A timeout was performed prior to the initiation of the procedure. The patient was positioned prone and localization fluoroscopy was performed of the pelvis to demonstrate the iliac marrow spaces. Maximal barrier sterile technique utilized including caps, mask, sterile gowns, sterile gloves, large sterile drape, hand hygiene, and betadine prep. Under sterile conditions and local anesthesia, an 11 gauge coaxial bone biopsy needle was advanced into the left iliac marrow space. Needle position was confirmed with imaging. Initially, bone marrow aspiration was performed. Next, the 11 gauge outer cannula was utilized to obtain a left iliac bone marrow core biopsy. Needle was removed. Hemostasis was obtained with compression. The patient tolerated the procedure well. Samples were prepared with the cytotechnologist. IMPRESSION: Successful image guided left iliac bone marrow aspiration and core biopsy. Electronically Signed   By: Wilkie Lent M.D.   On: 03/28/2024 11:35   CT BONE TROCAR/NEEDLE BIOPSY DEEP Result Date: 03/26/2024 INDICATION: Lytic right iliac acetabular bone lesion EXAM: CT CORE BIOPSY  RIGHT ACETABULAR LYTIC BONE LESION TECHNIQUE: Multidetector CT imaging of the PELVIS was performed following the standard protocol without IV contrast. RADIATION DOSE REDUCTION: This exam was performed according to the departmental dose-optimization program which includes automated exposure control, adjustment of the mA and/or kV according to patient size and/or use of iterative reconstruction technique. MEDICATIONS: 1% lidocaine  local ANESTHESIA/SEDATION: Moderate (conscious) sedation was employed during this procedure. A total of Versed  2.0 mg and Fentanyl  100 mcg was administered intravenously by the radiology nurse. Total intra-service moderate Sedation Time: 10 minutes. The patient's level of consciousness and vital signs were monitored continuously by radiology nursing throughout the procedure under my direct supervision. COMPLICATIONS: None immediate. PROCEDURE: Informed written consent was obtained from the patient after a thorough discussion of the procedural risks, benefits and alternatives. All questions were addressed. Maximal Sterile Barrier Technique was utilized including caps, mask, sterile gowns, sterile gloves, sterile drape, hand hygiene and skin antiseptic. A timeout was performed prior to the initiation of the procedure. Previous imaging reviewed. Patient positioned supine. Noncontrast localization CT performed. The right iliac acetabular lytic bone lesion was localized and marked for an anterior approach. Under sterile conditions and local anesthesia, the 17 gauge guide needle was advanced from an anterior oblique approach into the right acetabular lytic bone lesion. Needle position confirmed with CT. 18 gauge core biopsies obtained of the soft tissue lytic component. Samples were intact and non fragmented. These were placed in formalin. Needle removed. Postprocedure imaging demonstrates no hemorrhage or hematoma. Patient tolerated the biopsy  well. IMPRESSION: Successful CT-guided right iliac  acetabular lytic bone lesion 18 gauge core biopsy. Electronically Signed   By: CHRISTELLA.  Shick M.D.   On: 03/26/2024 15:08   MR WRIST RIGHT W WO CONTRAST Result Date: 03/25/2024 CLINICAL DATA:  Wrist pain, chronic, inflammatory arthritis suspected, xray done EXAM: MR OF THE RIGHT WRIST WITHOUT AND WITH CONTRAST TECHNIQUE: Multiplanar multisequence MR imaging of the right wrist was performed both before and after the administration of intravenous contrast. CONTRAST:  10mL GADAVIST  GADOBUTROL  1 MMOL/ML IV SOLN COMPARISON:  Right wrist radiographs 03/20/2024. CT of the right hand 03/22/2024. FINDINGS: Ligaments: The scapholunate and lunotriquetral ligaments appear intact. Triangular fibrocartilage: Probable small central degenerative tear of the triangular fibrocartilage, best seen on the coronal images. The ulnar variance is neutral. Tendons: The flexor and extensor tendons appear intact. There is a small to moderate amount of extensor tendon sheath fluid and synovial enhancement following contrast. There is also mild synovial enhancement within the carpal tunnel. Carpal tunnel/median nerve: Unremarkable. Guyon's canal: Unremarkable. Joint/cartilage: As seen on radiographs and CT, there are moderate radiocarpal and intercarpal degenerative changes with scattered subchondral cysts and osteophytes. Degenerative changes are also present at the 1st carpometacarpal and visualized metacarpal phalangeal joints. Moderate amount of fluid within the midcarpal, radiocarpal and distal radioulnar compartments with associated mild synovial enhancement following contrast. Bones/carpal alignment: The carpal bone alignment is normal.No evidence of acute fracture, dislocation or osteomyelitis. Other: Moderate generalized subcutaneous edema throughout the dorsal soft tissues of the wrist and hand. Mild associated subcutaneous enhancement, suggesting possible cellulitis. No organized fluid collection, foreign body or soft tissue emphysema  identified. IMPRESSION: 1. Moderate generalized subcutaneous edema throughout the dorsal soft tissues of the wrist and hand with mild associated subcutaneous enhancement, suggesting possible cellulitis. No organized fluid collection, foreign body or soft tissue emphysema identified. 2. Moderate radiocarpal and intercarpal degenerative changes with joint effusions, scattered subchondral cysts and osteophytes. Findings could be secondary to osteoarthritis, inflammatory or crystalline arthropathy. No evidence of osteomyelitis. 3. Mild extensor and flexor tenosynovitis. 4. Probable small central degenerative tear of the triangular fibrocartilage. Electronically Signed   By: Elsie Perone M.D.   On: 03/25/2024 09:52   CT HAND RIGHT W WO CONTRAST Result Date: 03/23/2024 EXAM: CT RIGHT HAND, WITH AND WITHOUT IV CONTRAST TECHNIQUE: Axial images were acquired through the right hand without and with IV contrast. 75 mL of Omnipaque  300 was administered intravenously. Reformatted images were reviewed. Automated exposure control, iterative reconstruction, and/or weight based adjustment of the mA/kV was utilized to reduce the radiation dose to as low as reasonably achievable. COMPARISON: None available. CLINICAL HISTORY: Hand pain, chronic, inflammatory arthritis suspected, xray done. FINDINGS: BONES AND JOINTS: No acute fracture or focal osseous lesion. No dislocation. Osteoarthritis observed with mild spurring and interarticular space narrowing of the interphalangeal joints with more striking degenerative spurring of the interphalangeal joint of the thumb. Mild spurring of the 1st metatarsal head and minimal spurring at the 1st carpometacarpal articulation. Radiocarpal osteoarthritis noted with particular loss of articular space between the distal radial articular surface and the lunate with associated degenerative subcortical cyst formation and subcortical sclerosis along the articulation between the distal radius and  lunate as on image 27 series 7. Small degenerative subcortical cyst in the distal pole of the scaphoid. Mild spurring at the articulation of the scaphoid and the trapezium. Degenerative subcortical cyst in the head of the 3rd metacarpal. No well defined erosions. Dorsal spurring of the distal radial articular margin on image 46 series  8 with a chronically fragmented spur of the anterior radial lip on the same image. SOFT TISSUES: The soft tissues are unremarkable. IMPRESSION: 1. Osteoarthritis involving the interphalangeal joints (including the thumb interphalangeal joint), radiocarpal joint with distal radiuslunate joint space loss and associated subcortical cysts and sclerosis, scaphoid-trapezium articulation, and mild spurring at the 1st carpometacarpal articulation, consistent with degenerative arthropathy. Electronically signed by: Ryan Salvage MD 03/23/2024 10:42 AM EST RP Workstation: [id number]   CT CHEST ABDOMEN PELVIS W CONTRAST Result Date: 03/21/2024 EXAM: CT CHEST, ABDOMEN AND PELVIS WITH CONTRAST 03/21/2024 03:11:07 AM TECHNIQUE: CT of the chest, abdomen and pelvis was performed with the administration of 100 mL of iohexol  (OMNIPAQUE ) 300 MG/ML solution intravenous contrast. Multiplanar reformatted images are provided for review. Automated exposure control, iterative reconstruction, and/or weight based adjustment of the mA/kV was utilized to reduce the radiation dose to as low as reasonably achievable. COMPARISON: Plain film and MRI from earlier in the same day. CLINICAL HISTORY: Lytic lesion within the pelvis, evaluate for additional lytic lesions . FINDINGS: CHEST: MEDIASTINUM AND LYMPH NODES: Heart is within normal limits. No coronary calcifications are noted. Pericardium is unremarkable. The central airways are clear. No mediastinal, hilar or axillary lymphadenopathy. The thoracic aorta is within normal limits. The pulmonary artery is visualized and is within normal limits. The esophagus  as visualized is within normal limits. Thoracic inlet is within normal limits. LUNGS AND PLEURA: Lungs are well aerated bilaterally. Patchy atelectatic changes are noted in bases bilaterally. No sizable parenchymal nodule is noted. No pleural effusion or pneumothorax. ABDOMEN AND PELVIS: LIVER: Liver is within normal limits. GALLBLADDER AND BILE DUCTS: Gallbladder is within normal limits. No biliary ductal dilatation. SPLEEN: Spleen is unremarkable. PANCREAS: Pancreas is unremarkable. ADRENAL GLANDS: The adrenal glands are well visualized and within normal limits. KIDNEYS, URETERS AND BLADDER: Kidneys are well visualized and within normal limits with the exception of a tiny cyst within the left kidney. No follow-up is recommended. No stones in the kidneys or ureters. The bladder is partially distended. GI AND BOWEL: Stomach and small bowel are within normal limits. No obstructive or inflammatory changes of the colon are noted. The appendix is within normal limits. There is no bowel obstruction. REPRODUCTIVE ORGANS: No acute abnormality. A small cyst is noted centrally within the seminal vesicle. PERITONEUM AND RETROPERITONEUM: No ascites. No free air. VASCULATURE: Aorta is normal in caliber. ABDOMINAL AND PELVIS LYMPH NODES: No lymphadenopathy. BONES AND SOFT TISSUES: Bony structures show a lytic lesion within the right acetabulum superiorly similar to that seen on recent MRI. Lytic changes are noted along the inferior aspect of the L5 vertebral body again, likely representing a schmorl's node. No other lytic lesions are seen. No focal soft tissue abnormality. IMPRESSION: 1. Lytic lesion within the right acetabulum superiorly, similar to recent MRI, with no additional lytic lesions identified. Electronically signed by: Oneil Devonshire MD 03/21/2024 03:28 AM EST RP Workstation: [id number]   MR PELVIS W WO CONTRAST Result Date: 03/20/2024 EXAM: MRI OF THE PELVIS WITH AND WITHOUT INTRAVENOUS CONTRAST 03/20/2024  05:09:16 PM TECHNIQUE: Multiplanar magnetic resonance images of the pelvis with and without intravenous contrast. COMPARISON: None available. CLINICAL HISTORY: Aggressive right acetabular lesion on radiography, right hip pain. FINDINGS: SOFT TISSUES: Low level adjacent edema tracking along the deep margin of the iliopsoas. Low level edema posteriorly in the left greater trochanter likely due to adjacent gluteus medius tendinopathy. JOINTS: No dislocation or significant effusion. LIMITED INTRAPELVIC CONTENTS: Utricle cyst or mullerian remnant along the posterior superior prostate  gland. BONES: MRI confirms an aggressive lytic expansile 3.7 x 2.7 x 3.2 cm mass in the right upper acetabulum with surrounding marrow edema and enhancement. There is cortical demineralization/thinning associated with this lesion. Enhancement along the inferior endplate of L5, please see the dedicated lumbar spine MRI report for further characterization. 0.8 cm mildly enhancing lesion in the ischial tuberosity on image 43 series 8, probably a hemangioma given the T1 signal characteristics, although technically nonspecific due to small size. Metal artifact from intramedullary nail in the right femur. IMPRESSION: 1. Aggressive lytic expansile 3.7 x 2.7 x 3.2 cm mass in the right upper acetabulum with surrounding marrow edema, enhancement, and cortical demineralization/thinning. Lytic metastatic disease or myeloma are the top differential diagnostic considerations, correlate with serologic indicators in assessing for myeloma, and consider further technical imaging in the context of oncology workup. 2. 0.8 cm mildly enhancing lesion in the right ischial tuberosity, probably a hemangioma but technically nonspecific due to small size. Recommend attention on follow-up imaging. 3. Low level edema posteriorly in the left greater trochanter, likely due to adjacent gluteus medius tendinopathy. Electronically signed by: Ryan Salvage MD 03/20/2024  05:37 PM EST RP Workstation: [id number]   MR LUMBAR SPINE WO CONTRAST Result Date: 03/20/2024 EXAM: MRI LUMBAR SPINE 03/20/2024 05:09:16 PM TECHNIQUE: Multiplanar multisequence MRI of the lumbar spine was performed without the administration of intravenous contrast. COMPARISON: None available. CLINICAL HISTORY: Lumbar radiculopathy with right thigh pain. FINDINGS: BONES AND ALIGNMENT: Normal alignment. Normal vertebral body heights. Congenitally short pedicles in the lumbar spine. Focal inferior endplate irregularity with surrounding marrow edema along the inferior endplate of L5, most resembling an acute Schmorl node; however, given findings in the right acetabulum, the possibility of a metastatic lesion along the inferior endplate of L5 is not totally ruled out. Adjacent type 1 and type 2 degenerative endplate findings noted. Background marrow signal is unremarkable unless otherwise stated. SPINAL CORD: Normal appearing conus medullaris terminates at L1-L2. SOFT TISSUES: No paraspinal mass. LIMITATIONS/ARTIFACTS: Motion artifact is present, reducing diagnostic sensitivity and specificity. DISC DESICCATION: Disc desiccation at L3-L4, L4-L5, and L1-L2. T12-L1: Unremarkable. L1-L2: Disc bulge. No impingement. L2-L3: Disc bulge and mild facet arthropathy. No impingement. L3-L4: Moderate central stenosis due to disc bulge and short pedicles. L4-L5: Moderate central stenosis with moderate right subarticular lateral recess stenosis and moderate right foraminal stenosis due to disc bulge, right lateral recess and foraminal disc protrusion, facet arthropathy, and short pedicles. L5-S1: Borderline bilateral foraminal stenosis due to short pedicles, disc osteophyte complex, and facet arthropathy. IMPRESSION: 1. Moderate central stenosis with moderate right subarticular lateral recess stenosis and moderate right foraminal stenosis at L4-5 due to disc bulge, right lateral recess and foraminal disc protrusion, facet  arthropathy, and short pedicles. 2. Moderate central stenosis at L3-4 due to disc bulge and short pedicles. 3. Focal inferior endplate irregularity with surrounding marrow edema along the inferior endplate of L5, most resembling an acute Schmorl's node; metastatic lesion cannot be completely excluded. Electronically signed by: Ryan Salvage MD 03/20/2024 05:28 PM EST RP Workstation: [id number]   DG Femur Min 2 Views Right Result Date: 03/20/2024 EXAM: 2 VIEW(S) XRAY OF THE FEMUR 03/20/2024 01:05:00 PM COMPARISON: 03/29/2010 CLINICAL HISTORY: hip pain/ h/o femur fracture with hardware FINDINGS: BONES AND JOINTS: Intramedullary rod and screws in place traversing healed fracture deformity of mid femoral diaphysis, with single proximal and 2 distal interlocking screws. Demineralization of the upper medial acetabulum, underlying lytic lesion not excluded. This is further discussed in the hip/pelvis radiograph report. No  joint dislocation. SOFT TISSUES: The soft tissues are unremarkable. IMPRESSION: 1. Demineralization of the upper medial right acetabulum with possible underlying lytic lesion; refer to today's hip/pelvis radiograph report for further discussion. 2. Intramedullary nail traversing healed mid femoral diaphyseal fracture, unchanged hardware alignment. Electronically signed by: Ryan Salvage MD 03/20/2024 02:46 PM EST RP Workstation: [id number]   DG Hip Unilat With Pelvis 2-3 Views Right Result Date: 03/20/2024 EXAM: 2 or more VIEW(S) XRAY OF THE HIP 03/20/2024 01:05:00 PM COMPARISON: 03/29/2010 CLINICAL HISTORY: hip pain/ h/o femur fracture with hardware FINDINGS: BONES AND JOINTS: Retrograde intramedullary nail in the right femur. Abnormal 2.1 cm lucency in the right upper medial acetabulum with demineralization of the cortical margin along the acetabulum concerning for a possible lytic lesion. Lytic osseous metastatic disease or myeloma not excluded. Dedicated MRI of the bony pelvis is  recommended for more complete assessment. Mild degenerative chondral thinning in both hips. The hip joint is maintained. SOFT TISSUES: The soft tissues are unremarkable. IMPRESSION: 1. Abnormal 2.1 cm lucency in the right upper medial acetabulum with cortical demineralization, concerning for a lytic lesion; lytic osseous metastatic disease or myeloma not excluded, recommend dedicated MRI of the bony pelvis for further assessment. 2. Mild degenerative chondral thinning in both hips. Electronically signed by: Ryan Salvage MD 03/20/2024 02:43 PM EST RP Workstation: [id number]   DG Wrist Complete Right Result Date: 03/20/2024 EXAM: 3 OR MORE VIEW(S) XRAY OF THE WRIST 03/20/2024 12:16:00 PM COMPARISON: None available. CLINICAL HISTORY: pain swelling FINDINGS: BONES AND JOINTS: No acute fracture. No focal osseous lesion. No joint dislocation. SOFT TISSUES: Mild soft tissue swelling. IMPRESSION: 1. Mild soft tissue swelling. 2. No acute fracture or dislocation. Electronically signed by: Donnice Mania MD 03/20/2024 12:49 PM EST RP Workstation: [id number]   (Echo, Carotid, EGD, Colonoscopy, ERCP)    Subjective: No complaints feels great  Discharge Exam: Vitals:   03/29/24 2034 03/30/24 0609  BP: (!) 146/79 (!) 173/88  Pulse: 84 83  Resp: 16 16  Temp: 98.2 F (36.8 C) 98.7 F (37.1 C)  SpO2: 100% 98%   Vitals:   03/29/24 0543 03/29/24 1335 03/29/24 2034 03/30/24 0609  BP: (!) 151/77 (!) 162/80 (!) 146/79 (!) 173/88  Pulse: 67 77 84 83  Resp: 18 16 16 16   Temp: 98.2 F (36.8 C) 98 F (36.7 C) 98.2 F (36.8 C) 98.7 F (37.1 C)  TempSrc: Oral Oral Oral Oral  SpO2: 95% 98% 100% 98%  Weight:      Height:        General: Pt is alert, awake, not in acute distress Cardiovascular: RRR, S1/S2 +, no rubs, no gallops Respiratory: CTA bilaterally, no wheezing, no rhonchi Abdominal: Soft, NT, ND, bowel sounds + Extremities: no edema, no cyanosis    The results of significant diagnostics  from this hospitalization (including imaging, microbiology, ancillary and laboratory) are listed below for reference.     Microbiology: Recent Results (from the past 240 hours)  Culture, blood (single)     Status: None   Collection Time: 03/21/24  1:39 AM   Specimen: BLOOD  Result Value Ref Range Status   Specimen Description   Final    BLOOD SITE NOT SPECIFIED Performed at [facility], [address] [place], [place] [address]    Special Requests   Final    BOTTLES DRAWN AEROBIC AND ANAEROBIC Blood Culture results may not be optimal due to an inadequate volume of blood received in culture bottles Performed at [facility],  [address], [place], [place] [address]    Culture   Final    NO GROWTH 5 DAYS Performed at [facility], [address], [place], [place] [address]    Report Status 03/26/2024 FINAL  Final     Labs: BNP (last 3 results) No results for input(s): BNP in the last 8760 hours. Basic Metabolic Panel: Recent Labs  Lab 03/24/24 0417 03/25/24 0421 03/26/24 0403 03/27/24 0455  NA 137 137 136 135  K 4.6 4.5 4.3 4.5  CL 99 99 98 99  CO2 28 28 28 27   GLUCOSE 150* 141* 136* 152*  BUN 16 15 15 15   CREATININE 1.04 1.12 1.09 1.04  CALCIUM  9.0 9.1 9.2 9.6   Liver Function Tests: Recent Labs  Lab 03/24/24 0417 03/25/24 0421  AST 30 59*  ALT 26 50*  ALKPHOS 65 64  BILITOT 0.5 0.5  PROT 7.7 7.4  ALBUMIN 3.5 3.2*   No results for input(s): LIPASE, AMYLASE in the last 168 hours. No results for input(s): AMMONIA in the last 168 hours. CBC: Recent Labs  Lab 03/24/24 0417 03/25/24 0421 03/26/24 0403 03/27/24 0455 03/28/24 1000  WBC 8.7 8.5 9.0 9.7 8.9  NEUTROABS  --   --   --   --  6.2  HGB 14.9 14.2 13.2 13.1 14.0  HCT 44.8 42.3 39.5 39.2 42.6  MCV 97.2 98.4 97.3 97.8 98.6  PLT 203 214 249 263 332   Cardiac Enzymes: No results for input(s): CKTOTAL, CKMB, CKMBINDEX, TROPONINI in the  last 168 hours. BNP: Invalid input(s): POCBNP CBG: No results for input(s): GLUCAP in the last 168 hours. D-Dimer No results for input(s): DDIMER in the last 72 hours. Hgb A1c No results for input(s): HGBA1C in the last 72 hours. Lipid Profile No results for input(s): CHOL, HDL, LDLCALC, TRIG, CHOLHDL, LDLDIRECT in the last 72 hours. Thyroid function studies No results for input(s): TSH, T4TOTAL, T3FREE, THYROIDAB in the last 72 hours.  Invalid input(s): FREET3 Anemia work up No results for input(s): VITAMINB12, FOLATE, FERRITIN, TIBC, IRON, RETICCTPCT in the last 72 hours. Urinalysis    Component Value Date/Time   COLORURINE STRAW (A) 03/06/2023 0429   APPEARANCEUR CLEAR 03/06/2023 0429   LABSPEC 1.017 03/06/2023 0429   PHURINE 5.0 03/06/2023 0429   GLUCOSEU >=500 (A) 03/06/2023 0429   HGBUR NEGATIVE 03/06/2023 0429   BILIRUBINUR NEGATIVE 03/06/2023 0429   KETONESUR NEGATIVE 03/06/2023 0429   PROTEINUR NEGATIVE 03/06/2023 0429   UROBILINOGEN 0.2 12/26/2012 0525   NITRITE NEGATIVE 03/06/2023 0429   LEUKOCYTESUR NEGATIVE 03/06/2023 0429   Sepsis Labs Recent Labs  Lab 03/25/24 0421 03/26/24 0403 03/27/24 0455 03/28/24 1000  WBC 8.5 9.0 9.7 8.9   Microbiology Recent Results (from the past 240 hours)  Culture, blood (single)     Status: None   Collection Time: 03/21/24  1:39 AM   Specimen: BLOOD  Result Value Ref Range Status   Specimen Description   Final    BLOOD SITE NOT SPECIFIED Performed at [facility], [address] [place], [place] [address]    Special Requests   Final    BOTTLES DRAWN AEROBIC AND ANAEROBIC Blood Culture results may not be optimal due to an inadequate volume of blood received in culture bottles Performed at [facility], [address], [place], [place] [address]    Culture   Final    NO GROWTH 5 DAYS Performed at [facility], [address],  [place], [place] [address]    Report Status 03/26/2024 FINAL  Final     Time coordinating discharge: Over 35 minutes  SIGNED:   Erle Odell Castor, MD  [facility] 03/30/2024, 9:03 AM Pager   If 7PM-7AM, please contact night-coverage [email or web address] Password [email or web address]

## 2024-04-02 ENCOUNTER — Other Ambulatory Visit: Payer: Self-pay

## 2024-04-02 DIAGNOSIS — C9000 Multiple myeloma not having achieved remission: Secondary | ICD-10-CM

## 2024-04-03 ENCOUNTER — Other Ambulatory Visit: Payer: Self-pay

## 2024-04-03 NOTE — Progress Notes (Signed)
 Faxed and emailed prior authorization for [phone] for the pt's PET Scan which currently scheduled on 04/13/2024 to [facility] and the [facility] at the [facility] for prior authorization.  Fax and email confirmation received.  Awaiting authorization response.

## 2024-04-04 ENCOUNTER — Encounter (HOSPITAL_COMMUNITY): Payer: Self-pay

## 2024-04-04 ENCOUNTER — Other Ambulatory Visit: Payer: Self-pay

## 2024-04-06 ENCOUNTER — Encounter: Payer: Self-pay | Admitting: Hematology

## 2024-04-06 ENCOUNTER — Other Ambulatory Visit: Payer: Self-pay | Admitting: Hematology

## 2024-04-06 ENCOUNTER — Other Ambulatory Visit: Payer: Self-pay

## 2024-04-06 ENCOUNTER — Encounter (HOSPITAL_COMMUNITY): Payer: Self-pay

## 2024-04-06 ENCOUNTER — Telehealth: Payer: Self-pay

## 2024-04-06 MED ORDER — TRAMADOL HCL 50 MG PO TABS: 50.0000 mg | ORAL_TABLET | Freq: Four times a day (QID) | ORAL | 0 refills | Status: AC | PRN

## 2024-04-06 NOTE — Telephone Encounter (Signed)
 Patient called in stating he is having pain that is a 8/10. He has been taking Advil  600 mg but it is no longer working. He would like something stronger called in for him to [facility] on [address] [place].

## 2024-04-09 ENCOUNTER — Telehealth: Payer: Self-pay

## 2024-04-09 ENCOUNTER — Inpatient Hospital Stay: Admitting: Hematology

## 2024-04-09 DIAGNOSIS — C9000 Multiple myeloma not having achieved remission: Secondary | ICD-10-CM | POA: Insufficient documentation

## 2024-04-09 NOTE — Progress Notes (Signed)
START ON PATHWAY REGIMEN - Multiple Myeloma and Other Plasma Cell Dyscrasias   DaraVRd (Daratumumab SUBQ + Bortezomib SUBQ D1,4,8,11 + Lenalidomide PO D1-14 + Dexamethasone 20 mg IV/PO D1,2,8,9,15,16) q21 Days (Induction Schema):   A cycle is every 21 days:     Lenalidomide      Dexamethasone      Bortezomib      Daratumumab and hyaluronidase-fihj    DaraVRd (Daratumumab SUBQ + Bortezomib SUBQ D1,4,8,11 + Lenalidomide PO D1-14 + Dexamethasone 20 mg IV/PO D1,2,8,9,15,16) q21 Days (Consolidation Schema):   A cycle is every 21 days:     Lenalidomide      Dexamethasone      Bortezomib      Daratumumab and hyaluronidase-fihj   **Always confirm dose/schedule in your pharmacy ordering system**  Patient Characteristics: Multiple Myeloma, Newly Diagnosed, Transplant Eligible, Standard Risk Disease Classification: Multiple Myeloma Therapeutic Status: Newly Diagnosed R2-ISS Staging: Awaiting Test Results Is Patient Eligible for Transplant<= Transplant Eligible Risk Status: Standard Risk Intent of Therapy: Curative Intent, Discussed with Patient

## 2024-04-09 NOTE — Telephone Encounter (Signed)
 LVM stating that Dr Lanny would like to see the pt in clinic on 04/10/2024 @3pm .  Requested for the pt to give Dr Demetra office a call if the pt is unable to come in on 04/10/2024.  Provided return telephone number.  Awaiting pt's response.

## 2024-04-09 NOTE — Telephone Encounter (Signed)
 Pt returned call from earlier today and confirmed appt date & time.

## 2024-04-10 ENCOUNTER — Other Ambulatory Visit: Payer: Self-pay

## 2024-04-10 ENCOUNTER — Inpatient Hospital Stay: Attending: Hematology | Admitting: Hematology

## 2024-04-10 VITALS — BP 132/65 | HR 90 | Temp 98.2°F | Resp 17 | Wt 214.5 lb

## 2024-04-10 DIAGNOSIS — C9000 Multiple myeloma not having achieved remission: Secondary | ICD-10-CM

## 2024-04-10 DIAGNOSIS — Z7984 Long term (current) use of oral hypoglycemic drugs: Secondary | ICD-10-CM | POA: Insufficient documentation

## 2024-04-10 DIAGNOSIS — Z79899 Other long term (current) drug therapy: Secondary | ICD-10-CM | POA: Insufficient documentation

## 2024-04-10 DIAGNOSIS — Z7961 Long term (current) use of immunomodulator: Secondary | ICD-10-CM | POA: Insufficient documentation

## 2024-04-10 DIAGNOSIS — E1165 Type 2 diabetes mellitus with hyperglycemia: Secondary | ICD-10-CM | POA: Diagnosis not present

## 2024-04-10 NOTE — Addendum Note (Signed)
 Addended by: LANNY CALLANDER on: 04/10/2024 06:31 PM   Modules accepted: Orders

## 2024-04-10 NOTE — Progress Notes (Signed)
 [facility]   [phone] [phone]   Clinic Follow up Note   [facility] Care Team: [facility] - General (General Practice)  Date of Service:  04/10/2024  CHIEF COMPLAINT: Review bone marrow biopsy results  CURRENT THERAPY:  Pending chemotherapy D-VRD  Oncology History   Multiple myeloma (HCC) RISS stage I, standard risk  -diagnosed in 02/2024. Pt presented with severe right hip pain and CT and MRI showed a lytic bone lesion in right acetabular.  CT-guided bone biopsy showed plasma neoplasm. -Multiple myeloma panel showed IgG kappa light chain M protein 1.2, CBC and CMP were unremarkable. - He underwent a bone marrow biopsy subsequently, which showed 70% plasma cells, cytogenetics was normal, FISH panel showed 13 q. deletion, gain of 1 q. and 11/11 q, this is a standard risk. - He is a candidate for bone marrow transplant, I recommend first-line chemotherapy daratumumab, Revlimid, Velcade and dexamethasone. - Will refer him to radiation oncology for palliative radiation to the right acetabular bone lesion.   Assessment & Plan Stage 1 multiple myeloma Newly diagnosed, standard risk multiple myeloma with approximately 70% plasma cell involvement on bone marrow biopsy. He experiences significant hip pain due to bone involvement, with preserved renal function and normocalcemia. No high-risk cytogenetic features identified. He is a candidate for autologous stem cell transplant following induction therapy. Disease and therapy confer increased risk for fracture and thrombosis. - Referred to radiation oncology for local radiation therapy to the painful pelvic bone lesion for pain control and improved function. - Planned induction chemotherapy with four-drug regimen: lenalidomide (Revlimid, oral), dexamethasone (steroid), daratumumab (antibody injection), and bortezomib (Velcade, antibody injection). - Scheduled chemotherapy education class and  provided written materials. - Ordered denosumab (Zemaira) infusions every three months for two years to strengthen bone and reduce fracture risk; discussed risks of hypocalcemia and transient flu-like symptoms. - Initiated calcium  (500-600 mg twice daily) and vitamin D (=1000 IU daily) supplementation. - Advised aggressive hydration to protect renal function and prevent hypercalcemia. - Prescribed acyclovir for herpes zoster prophylaxis during chemotherapy. - Recommended low-dose aspirin for thromboprophylaxis due to increased risk of venous thromboembolism with therapy. - Advised to receive influenza, COVID-19, pneumococcal, shingles (Shingrix, two doses), and RSV vaccines prior to chemotherapy, spacing each by at least 3-5 days. - Planned frequent laboratory monitoring (CBC, renal and hepatic function) during treatment; labs to be drawn at each visit. - Arranged referral to [facility] for autologous stem cell transplant evaluation per his preference. - Coordinated care with nurse navigator for scheduling and support. - Provided anticipatory guidance regarding infection risk, neuropathy, gastrointestinal side effects, and instructed to promptly report fever, chills, cough, or urinary symptoms. - Advised fall precautions due to increased fracture risk. - Chemotherapy to commence after completion of radiation therapy. - Will monitor for cytopenias and transfuse or hold therapy as indicated. - Will monitor for secondary malignancy risk with long-term lenalidomide and ensure appropriate cancer screening (PSA, colonoscopy).  Type 2 diabetes mellitus Type 2 diabetes mellitus. Anticipated transient hyperglycemia secondary to steroid therapy as part of myeloma treatment, particularly within 2-3 days post-dose. He currently utilizes fingerstick glucose monitoring. - Instructed to monitor blood glucose at least daily, with increased frequency following steroid administration. - Advised to coordinate with primary  care physician regarding potential adjustment of diabetes medications during periods of steroid-induced hyperglycemia. - Reinforced continuation of current diabetes management and to report significant hyperglycemia.  Plan - I discussed his bone marrow biopsy results, staging, and overall prognosis  and treatment plan. - Urgent referral to radiation oncology for palliative radiation - He is scheduled for a PET scan on December 19 - I advised him to take all available immunization shots - Chemo class - Plan to start chemotherapy after radiation.   SUMMARY OF ONCOLOGIC HISTORY: Oncology History  Multiple myeloma (HCC)  03/26/2024 Cancer Staging   Staging form: Plasma Cell Myeloma and Plasma Cell Disorders, AJCC 8th Edition - Clinical stage from 03/26/2024: RISS Stage I (Beta-2-microglobulin (mg/L): 2.2, Albumin (g/dL): 3.6, ISS: Stage I, High-risk cytogenetics: Absent, LDH: Normal) - Signed by Lanny Callander, MD on 04/09/2024 Stage prefix: Initial diagnosis Beta 2 microglobulin range (mg/L): Less than 3.5 Albumin range (g/dL): Greater than or equal to 3.5 Cytogenetics: 1q addition, Other mutation   04/09/2024 Initial Diagnosis   Multiple myeloma (HCC)   04/16/2024 -  Chemotherapy   Patient is on Treatment Plan : MYELOMA NEWLY DIAGNOSED TRANSPLANT CANDIDATE DaraVRd (Daratumumab SQ) with biweekly bortezomib (D1,4,8,11) q21d x 6 Cycles (Induction/Consolidation)        Discussed the use of AI scribe software for clinical note transcription with the patient, who gave verbal consent to proceed.  History of Present Illness Aaron Willis is a 63 year old male with newly diagnosed stage 1, standard-risk multiple myeloma who presents for initial outpatient hematology/oncology follow-up.  He was diagnosed with multiple myeloma after a bone marrow biopsy on March 28, 2024, which showed about 70% plasma cell infiltration. He has not started chemotherapy or bone-strengthening therapy and has not  previously reviewed his biopsy results.  He has severe right hip pain that requires crutches for ambulation and limits mobility. Walking without crutches is very painful, while sitting does not worsen the pain. He takes tramadol  once daily with partial relief and recently received a new prescription. He denies fever, chills, respiratory, cardiac, gastrointestinal, urinary, bleeding, bruising, neurologic symptoms, recent infections, night sweats, or weight loss.  He has not received influenza, COVID-19 booster, pneumococcal, shingles, or RSV vaccines. He is not taking calcium  or vitamin D yet but plans to start them. He is able to attend appointments but restricts activities outside the home because of pain and limited mobility.  He has type 2 diabetes managed with daily fingersticks and understands that planned steroid use may worsen hyperglycemia. He continues antihypertensive and lipid-lowering therapy. He has no thromboembolic events or secondary malignancies. He prefers to coordinate any future transplant care at [facility] but is open to other centers.     All other systems were reviewed with the patient and are negative.  MEDICAL HISTORY:  Past Medical History:  Diagnosis Date   Diabetes mellitus without complication (HCC)    Hypertension     SURGICAL HISTORY: Past Surgical History:  Procedure Laterality Date   FEMUR FRACTURE SURGERY Right    IR BONE MARROW BIOPSY & ASPIRATION  03/28/2024   KNEE SURGERY Left     I have reviewed the social history and family history with the patient and they are unchanged from previous note.  ALLERGIES:  has no active allergies.  MEDICATIONS:  Current Outpatient Medications  Medication Sig Dispense Refill   acetaminophen  (TYLENOL ) 500 MG tablet Take 500 mg by mouth every 6 (six) hours as needed.     atorvastatin  (LIPITOR) 40 MG tablet Take 40 mg by mouth daily.     Empagliflozin-metFORMIN HCl ER 25-1000 MG TB24 Take 1 tablet by mouth daily.      fluticasone  (FLONASE ) 50 MCG/ACT nasal spray Place 2 sprays into both nostrils daily. (Patient taking  differently: Place 2 sprays into both nostrils daily as needed for allergies.) 16 g 6   ibuprofen  (ADVIL ) 600 MG tablet Take 1 tablet (600 mg total) by mouth every 8 (eight) hours as needed.     losartan  (COZAAR ) 100 MG tablet Take 50 mg by mouth daily.     pantoprazole  (PROTONIX ) 40 MG tablet Take 1 tablet (40 mg total) by mouth daily. 30 tablet 0   traMADol  (ULTRAM ) 50 MG tablet Take 1 tablet (50 mg total) by mouth every 6 (six) hours as needed. 30 tablet 0   No current facility-administered medications for this visit.    PHYSICAL EXAMINATION: ECOG PERFORMANCE STATUS: 2 - Symptomatic, <50% confined to bed  Vitals:   04/10/24 1456 04/10/24 1458  BP: (!) 151/67 132/65  Pulse: 90   Resp: 17   Temp: 98.2 F (36.8 C)   SpO2: 98%    Wt Readings from Last 3 Encounters:  04/10/24 214 lb 8 oz (97.3 kg)  03/21/24 224 lb 10.4 oz (101.9 kg)  03/05/23 230 lb (104.3 kg)     GENERAL:alert, no distress and comfortable SKIN: skin color, texture, turgor are normal, no rashes or significant lesions EYES: normal, Conjunctiva are pink and non-injected, sclera clear NECK: supple, thyroid normal size, non-tender, without nodularity LYMPH:  no palpable lymphadenopathy in the cervical, axillary  LUNGS: clear to auscultation and percussion with normal breathing effort HEART: regular rate & rhythm and no murmurs and no lower extremity edema ABDOMEN:abdomen soft, non-tender and normal bowel sounds Musculoskeletal:no cyanosis of digits and no clubbing  NEURO: alert & oriented x 3 with fluent speech, no focal motor/sensory deficits  Physical Exam    LABORATORY DATA:  I have reviewed the data as listed    Latest Ref Rng & Units 03/28/2024   10:00 AM 03/27/2024    4:55 AM 03/26/2024    4:03 AM  CBC  WBC 4.0 - 10.5 K/uL 8.9  9.7  9.0   Hemoglobin 13.0 - 17.0 g/dL 85.9  86.8  86.7   Hematocrit 39.0  - 52.0 % 42.6  39.2  39.5   Platelets 150 - 400 K/uL 332  263  249         Latest Ref Rng & Units 03/27/2024    4:55 AM 03/26/2024    4:03 AM 03/25/2024    4:21 AM  CMP  Glucose 70 - 99 mg/dL 847  863  858   BUN 8 - 23 mg/dL 15  15  15    Creatinine 0.61 - 1.24 mg/dL 8.95  8.90  8.87   Sodium 135 - 145 mmol/L 135  136  137   Potassium 3.5 - 5.1 mmol/L 4.5  4.3  4.5   Chloride 98 - 111 mmol/L 99  98  99   CO2 22 - 32 mmol/L 27  28  28    Calcium  8.9 - 10.3 mg/dL 9.6  9.2  9.1   Total Protein 6.5 - 8.1 g/dL   7.4   Total Bilirubin 0.0 - 1.2 mg/dL   0.5   Alkaline Phos 38 - 126 U/L   64   AST 15 - 41 U/L   59   ALT 0 - 44 U/L   50       RADIOGRAPHIC STUDIES: I have personally reviewed the radiological images as listed and agreed with the findings in the report. No results found.    Orders Placed This Encounter  Procedures   Ambulatory referral to Radiation Oncology  Referral Priority:   Urgent    Referral Type:   Consultation    Referral Reason:   Specialty Services Required    Requested Specialty:   Radiation Oncology    Number of Visits Requested:   1   All questions were answered. The patient knows to call the clinic with any problems, questions or concerns. No barriers to learning was detected. The total time spent in the appointment was 60 minutes, including review of chart and various tests results, discussions about plan of care and coordination of care plan     Onita Mattock, MD 04/10/2024

## 2024-04-10 NOTE — Assessment & Plan Note (Signed)
 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 Pt presented with severe right hip pain and CT and MRI showed a lytic bone lesion in right acetabular.  CT-guided bone biopsy showed plasma neoplasm. -Multiple myeloma panel showed IgG kappa light chain M protein 1.2, CBC and CMP were unremarkable. - He underwent a bone marrow biopsy subsequently, which showed 70% plasma cells, cytogenetics was normal, FISH panel showed 13 q. deletion, gain of 1 q. and 11/11 q, this is a standard risk. - He is a candidate for bone marrow transplant, I recommend first-line chemotherapy daratumumab, Revlimid, Velcade and dexamethasone. - Will refer him to radiation oncology for palliative radiation to the right acetabular bone lesion.

## 2024-04-11 ENCOUNTER — Ambulatory Visit
Admission: RE | Admit: 2024-04-11 | Discharge: 2024-04-11 | Disposition: A | Discharge status: Home / Self Care | Source: Ambulatory Visit | Attending: Radiation Oncology | Admitting: Radiation Oncology

## 2024-04-11 VITALS — BP 142/91 | HR 93 | Temp 96.9°F | Resp 18 | Ht 66.0 in | Wt 212.4 lb

## 2024-04-11 DIAGNOSIS — E119 Type 2 diabetes mellitus without complications: Secondary | ICD-10-CM | POA: Insufficient documentation

## 2024-04-11 DIAGNOSIS — C9000 Multiple myeloma not having achieved remission: Secondary | ICD-10-CM | POA: Insufficient documentation

## 2024-04-11 DIAGNOSIS — Z7984 Long term (current) use of oral hypoglycemic drugs: Secondary | ICD-10-CM | POA: Insufficient documentation

## 2024-04-11 DIAGNOSIS — M6530 Trigger finger, unspecified finger: Secondary | ICD-10-CM | POA: Diagnosis not present

## 2024-04-11 DIAGNOSIS — Z803 Family history of malignant neoplasm of breast: Secondary | ICD-10-CM | POA: Diagnosis not present

## 2024-04-11 DIAGNOSIS — I10 Essential (primary) hypertension: Secondary | ICD-10-CM | POA: Insufficient documentation

## 2024-04-11 DIAGNOSIS — M898X5 Other specified disorders of bone, thigh: Secondary | ICD-10-CM

## 2024-04-11 DIAGNOSIS — M79651 Pain in right thigh: Secondary | ICD-10-CM | POA: Insufficient documentation

## 2024-04-11 DIAGNOSIS — Z79899 Other long term (current) drug therapy: Secondary | ICD-10-CM | POA: Insufficient documentation

## 2024-04-11 NOTE — Progress Notes (Signed)
 Called patient to introduce myself after receiving staff msg from Dr Lanny about patient. Introduced myself and my role in his care. Asked patient is he was aware of possible need for a stem cell transplant and that it would need to be at another facility. He understood and was aware, when asked if he preferred [facility] or [facility] he verbalized that he preferred [facility]. Dr Demetra nurse Anders asked that I let her know which he chose so that she can send information to the [facility] for his referrals. Will continue to monitor.

## 2024-04-11 NOTE — Progress Notes (Signed)
 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 male seen in consultation for radiation therapy.  He initially presented with R hip pain in late November. Work up with CT and MRI demonstrated a lytic bone lesion in the right acetabulum suspicious for multiple myeloma versus metastatic disease. CT guided biopsy of this lesion was completed and consistent with a plasma cell neoplasm. Myeloma labs revealed an elevated IgG w/ an M-spike. Bone marrow biopsy revealed 70% plasma cells w/ normal cytogenetics and FISH panel demonstrating standard risk disease with deletion of 13, gain of 1 q and 11/11 q.  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 She plans to initiate induction treatment in early to mid January with lenalidomide, dexamethasone, daratumumab and bortezomib.   He was referred here for consideration of palliative radiation to this painful R acetabular lesion. During hospitalization and workup of his multiple myeloma, orthopedic surgery was consulted and did not recommend prophylactic fixation. They did defer to trauma and ortho PA did note having discussed with ortho trauma but unclear what their official recommendations were or if any were made. No prophylactic fixation was was performed.  Currently describes his pain as a 6/10 in severity. Being treated with tramadol  which brings his pain down to a 5/10. His pain is worse with walking. That is the only major exacerbating factor. Does have some pain when sleeping and sometimes his pain is bad enough that it wakes him up at night. Currently using crutches for  ambulation.  PREVIOUS RADIATION THERAPY: No  AUTOIMMUNE DISEASE: No  MEDICAL DEVICES: No  PREGNANCY: No - male   PAST MEDICAL HISTORY:  Past Medical History:  Diagnosis Date   Diabetes mellitus without complication (HCC)    Hypertension        PAST SURGICAL HISTORY: Past Surgical History:  Procedure Laterality Date   FEMUR FRACTURE SURGERY Right    IR BONE MARROW BIOPSY & ASPIRATION  03/28/2024   KNEE SURGERY Left      FAMILY HISTORY:  Family History  Problem Relation Age of Onset   Breast cancer Mother      SOCIAL HISTORY:  reports that he has never smoked. He does not have any smokeless tobacco history on file. He reports current alcohol use of about 1.0 standard drink of alcohol per week. He reports that he does not use drugs.   ALLERGIES: Patient has no active allergies.   MEDICATIONS:  Current Outpatient Medications  Medication Sig Dispense Refill   acetaminophen  (TYLENOL ) 500 MG tablet Take 500 mg by mouth every 6 (six) hours as needed.     atorvastatin  (LIPITOR) 40 MG tablet Take 40 mg by mouth daily.     Empagliflozin-metFORMIN HCl ER 25-1000 MG TB24 Take 1 tablet by mouth daily.     fluticasone  (FLONASE ) 50 MCG/ACT nasal spray Place 2 sprays into both nostrils daily. (Patient taking differently: Place 2 sprays into both nostrils daily as needed for allergies.) 16 g 6   ibuprofen  (ADVIL ) 600 MG tablet Take 1 tablet (600 mg total) by mouth every 8 (  eight) hours as needed.     losartan  (COZAAR ) 100 MG tablet Take 50 mg by mouth daily.     pantoprazole  (PROTONIX ) 40 MG tablet Take 1 tablet (40 mg total) by mouth daily. 30 tablet 0   traMADol  (ULTRAM ) 50 MG tablet Take 1 tablet (50 mg total) by mouth every 6 (six) hours as needed. 30 tablet 0   No current facility-administered medications for this encounter.     REVIEW OF SYSTEMS: Per HPI  PHYSICAL EXAM:  Wt Readings from Last 3 Encounters:  04/11/24 212 lb 4 oz (96.3 kg)  04/11/24 212 lb 6.4 oz (96.3  kg)  04/10/24 214 lb 8 oz (97.3 kg)   Temp Readings from Last 3 Encounters:  04/11/24 (!) 96.9 F (36.1 C) (Temporal)  04/11/24 (!) 96.9 F (36.1 C)  04/10/24 98.2 F (36.8 C)   BP Readings from Last 3 Encounters:  04/11/24 (!) 142/91  04/11/24 (!) 142/91  04/10/24 132/65   Pulse Readings from Last 3 Encounters:  04/11/24 93  04/11/24 93  04/10/24 90   Pain Assessment Pain Score: 6 /10  Physical Exam Vitals and nursing note reviewed.  Constitutional:      General: He is not in acute distress. HENT:     Head: Normocephalic and atraumatic.  Eyes:     Extraocular Movements: Extraocular movements intact.  Cardiovascular:     Rate and Rhythm: Normal rate.  Pulmonary:     Effort: Pulmonary effort is normal. No respiratory distress.  Abdominal:     General: There is no distension.  Musculoskeletal:        General: No tenderness (no tenderness to palpation of R hip joint).     Comments: Using crutches for ambulation  Skin:    Findings: No rash.  Neurological:     General: No focal deficit present.     Mental Status: He is alert.     Comments: Using crutches for ambulation  Psychiatric:        Mood and Affect: Mood normal.     ECOG = 1  LABORATORY DATA:  Lab Results  Component Value Date   WBC 8.9 03/28/2024   HGB 14.0 03/28/2024   HCT 42.6 03/28/2024   MCV 98.6 03/28/2024   PLT 332 03/28/2024   Lab Results  Component Value Date   NA 135 03/27/2024   K 4.5 03/27/2024   CL 99 03/27/2024   CO2 27 03/27/2024   Lab Results  Component Value Date   ALT 50 (H) 03/25/2024   AST 59 (H) 03/25/2024   ALKPHOS 64 03/25/2024   BILITOT 0.5 03/25/2024      RADIOGRAPHY: IR BONE MARROW BIOPSY & ASPIRATION Result Date: 03/28/2024 INDICATION: 63 year old male with newly diagnosed lytic bone lesions concerning for possible myeloma. He presents image guided bone marrow biopsy. EXAM: FLUOROSCOPY GUIDED BONE MARROW ASPIRATION AND CORE BIOPSY Interventional Radiologist:   Wilkie LOIS Lent, MD MEDICATIONS: None. ANESTHESIA/SEDATION: 2 mg Versed  administered intravenously for anxiolysis. This does not constitute moderate sedation. FLUOROSCOPY: Radiation exposure index: 9 mGy, air kerma COMPLICATIONS: None immediate. Estimated blood loss: <25 mL PROCEDURE: Informed written consent was obtained from the patient after a thorough discussion of the procedural risks, benefits and alternatives. All questions were addressed. Maximal Sterile Barrier Technique was utilized including caps, mask, sterile gowns, sterile gloves, sterile drape, hand hygiene and skin antiseptic. A timeout was performed prior to the initiation of the procedure. The patient was positioned prone and localization fluoroscopy was performed of the pelvis  to demonstrate the iliac marrow spaces. Maximal barrier sterile technique utilized including caps, mask, sterile gowns, sterile gloves, large sterile drape, hand hygiene, and betadine prep. Under sterile conditions and local anesthesia, an 11 gauge coaxial bone biopsy needle was advanced into the left iliac marrow space. Needle position was confirmed with imaging. Initially, bone marrow aspiration was performed. Next, the 11 gauge outer cannula was utilized to obtain a left iliac bone marrow core biopsy. Needle was removed. Hemostasis was obtained with compression. The patient tolerated the procedure well. Samples were prepared with the cytotechnologist. IMPRESSION: Successful image guided left iliac bone marrow aspiration and core biopsy. Electronically Signed   By: Wilkie Lent M.D.   On: 03/28/2024 11:35   CT BONE TROCAR/NEEDLE BIOPSY DEEP Result Date: 03/26/2024 INDICATION: Lytic right iliac acetabular bone lesion EXAM: CT CORE BIOPSY RIGHT ACETABULAR LYTIC BONE LESION TECHNIQUE: Multidetector CT imaging of the PELVIS was performed following the standard protocol without IV contrast. RADIATION DOSE REDUCTION: This exam was performed according to the  departmental dose-optimization program which includes automated exposure control, adjustment of the mA and/or kV according to patient size and/or use of iterative reconstruction technique. MEDICATIONS: 1% lidocaine  local ANESTHESIA/SEDATION: Moderate (conscious) sedation was employed during this procedure. A total of Versed  2.0 mg and Fentanyl  100 mcg was administered intravenously by the radiology nurse. Total intra-service moderate Sedation Time: 10 minutes. The patient's level of consciousness and vital signs were monitored continuously by radiology nursing throughout the procedure under my direct supervision. COMPLICATIONS: None immediate. PROCEDURE: Informed written consent was obtained from the patient after a thorough discussion of the procedural risks, benefits and alternatives. All questions were addressed. Maximal Sterile Barrier Technique was utilized including caps, mask, sterile gowns, sterile gloves, sterile drape, hand hygiene and skin antiseptic. A timeout was performed prior to the initiation of the procedure. Previous imaging reviewed. Patient positioned supine. Noncontrast localization CT performed. The right iliac acetabular lytic bone lesion was localized and marked for an anterior approach. Under sterile conditions and local anesthesia, the 17 gauge guide needle was advanced from an anterior oblique approach into the right acetabular lytic bone lesion. Needle position confirmed with CT. 18 gauge core biopsies obtained of the soft tissue lytic component. Samples were intact and non fragmented. These were placed in formalin. Needle removed. Postprocedure imaging demonstrates no hemorrhage or hematoma. Patient tolerated the biopsy well. IMPRESSION: Successful CT-guided right iliac acetabular lytic bone lesion 18 gauge core biopsy. Electronically Signed   By: CHRISTELLA.  Shick M.D.   On: 03/26/2024 15:08   MR WRIST RIGHT W WO CONTRAST Result Date: 03/25/2024 CLINICAL DATA:  Wrist pain, chronic,  inflammatory arthritis suspected, xray done EXAM: MR OF THE RIGHT WRIST WITHOUT AND WITH CONTRAST TECHNIQUE: Multiplanar multisequence MR imaging of the right wrist was performed both before and after the administration of intravenous contrast. CONTRAST:  10mL GADAVIST  GADOBUTROL  1 MMOL/ML IV SOLN COMPARISON:  Right wrist radiographs 03/20/2024. CT of the right hand 03/22/2024. FINDINGS: Ligaments: The scapholunate and lunotriquetral ligaments appear intact. Triangular fibrocartilage: Probable small central degenerative tear of the triangular fibrocartilage, best seen on the coronal images. The ulnar variance is neutral. Tendons: The flexor and extensor tendons appear intact. There is a small to moderate amount of extensor tendon sheath fluid and synovial enhancement following contrast. There is also mild synovial enhancement within the carpal tunnel. Carpal tunnel/median nerve: Unremarkable. Guyon's canal: Unremarkable. Joint/cartilage: As seen on radiographs and CT, there are moderate radiocarpal and intercarpal degenerative changes with scattered subchondral cysts and  osteophytes. Degenerative changes are also present at the 1st carpometacarpal and visualized metacarpal phalangeal joints. Moderate amount of fluid within the midcarpal, radiocarpal and distal radioulnar compartments with associated mild synovial enhancement following contrast. Bones/carpal alignment: The carpal bone alignment is normal.No evidence of acute fracture, dislocation or osteomyelitis. Other: Moderate generalized subcutaneous edema throughout the dorsal soft tissues of the wrist and hand. Mild associated subcutaneous enhancement, suggesting possible cellulitis. No organized fluid collection, foreign body or soft tissue emphysema identified. IMPRESSION: 1. Moderate generalized subcutaneous edema throughout the dorsal soft tissues of the wrist and hand with mild associated subcutaneous enhancement, suggesting possible cellulitis. No  organized fluid collection, foreign body or soft tissue emphysema identified. 2. Moderate radiocarpal and intercarpal degenerative changes with joint effusions, scattered subchondral cysts and osteophytes. Findings could be secondary to osteoarthritis, inflammatory or crystalline arthropathy. No evidence of osteomyelitis. 3. Mild extensor and flexor tenosynovitis. 4. Probable small central degenerative tear of the triangular fibrocartilage. Electronically Signed   By: Elsie Perone M.D.   On: 03/25/2024 09:52   CT HAND RIGHT W WO CONTRAST Result Date: 03/23/2024 EXAM: CT RIGHT HAND, WITH AND WITHOUT IV CONTRAST TECHNIQUE: Axial images were acquired through the right hand without and with IV contrast. 75 mL of Omnipaque  300 was administered intravenously. Reformatted images were reviewed. Automated exposure control, iterative reconstruction, and/or weight based adjustment of the mA/kV was utilized to reduce the radiation dose to as low as reasonably achievable. COMPARISON: None available. CLINICAL HISTORY: Hand pain, chronic, inflammatory arthritis suspected, xray done. FINDINGS: BONES AND JOINTS: No acute fracture or focal osseous lesion. No dislocation. Osteoarthritis observed with mild spurring and interarticular space narrowing of the interphalangeal joints with more striking degenerative spurring of the interphalangeal joint of the thumb. Mild spurring of the 1st metatarsal head and minimal spurring at the 1st carpometacarpal articulation. Radiocarpal osteoarthritis noted with particular loss of articular space between the distal radial articular surface and the lunate with associated degenerative subcortical cyst formation and subcortical sclerosis along the articulation between the distal radius and lunate as on image 27 series 7. Small degenerative subcortical cyst in the distal pole of the scaphoid. Mild spurring at the articulation of the scaphoid and the trapezium. Degenerative subcortical cyst in  the head of the 3rd metacarpal. No well defined erosions. Dorsal spurring of the distal radial articular margin on image 46 series 8 with a chronically fragmented spur of the anterior radial lip on the same image. SOFT TISSUES: The soft tissues are unremarkable. IMPRESSION: 1. Osteoarthritis involving the interphalangeal joints (including the thumb interphalangeal joint), radiocarpal joint with distal radiuslunate joint space loss and associated subcortical cysts and sclerosis, scaphoid-trapezium articulation, and mild spurring at the 1st carpometacarpal articulation, consistent with degenerative arthropathy. Electronically signed by: Ryan Salvage MD 03/23/2024 10:42 AM EST RP Workstation: [id number]   CT CHEST ABDOMEN PELVIS W CONTRAST Result Date: 03/21/2024 EXAM: CT CHEST, ABDOMEN AND PELVIS WITH CONTRAST 03/21/2024 03:11:07 AM TECHNIQUE: CT of the chest, abdomen and pelvis was performed with the administration of 100 mL of iohexol  (OMNIPAQUE ) 300 MG/ML solution intravenous contrast. Multiplanar reformatted images are provided for review. Automated exposure control, iterative reconstruction, and/or weight based adjustment of the mA/kV was utilized to reduce the radiation dose to as low as reasonably achievable. COMPARISON: Plain film and MRI from earlier in the same day. CLINICAL HISTORY: Lytic lesion within the pelvis, evaluate for additional lytic lesions . FINDINGS: CHEST: MEDIASTINUM AND LYMPH NODES: Heart is within normal limits. No coronary calcifications are noted. Pericardium is  unremarkable. The central airways are clear. No mediastinal, hilar or axillary lymphadenopathy. The thoracic aorta is within normal limits. The pulmonary artery is visualized and is within normal limits. The esophagus as visualized is within normal limits. Thoracic inlet is within normal limits. LUNGS AND PLEURA: Lungs are well aerated bilaterally. Patchy atelectatic changes are noted in bases bilaterally. No sizable  parenchymal nodule is noted. No pleural effusion or pneumothorax. ABDOMEN AND PELVIS: LIVER: Liver is within normal limits. GALLBLADDER AND BILE DUCTS: Gallbladder is within normal limits. No biliary ductal dilatation. SPLEEN: Spleen is unremarkable. PANCREAS: Pancreas is unremarkable. ADRENAL GLANDS: The adrenal glands are well visualized and within normal limits. KIDNEYS, URETERS AND BLADDER: Kidneys are well visualized and within normal limits with the exception of a tiny cyst within the left kidney. No follow-up is recommended. No stones in the kidneys or ureters. The bladder is partially distended. GI AND BOWEL: Stomach and small bowel are within normal limits. No obstructive or inflammatory changes of the colon are noted. The appendix is within normal limits. There is no bowel obstruction. REPRODUCTIVE ORGANS: No acute abnormality. A small cyst is noted centrally within the seminal vesicle. PERITONEUM AND RETROPERITONEUM: No ascites. No free air. VASCULATURE: Aorta is normal in caliber. ABDOMINAL AND PELVIS LYMPH NODES: No lymphadenopathy. BONES AND SOFT TISSUES: Bony structures show a lytic lesion within the right acetabulum superiorly similar to that seen on recent MRI. Lytic changes are noted along the inferior aspect of the L5 vertebral body again, likely representing a schmorl's node. No other lytic lesions are seen. No focal soft tissue abnormality. IMPRESSION: 1. Lytic lesion within the right acetabulum superiorly, similar to recent MRI, with no additional lytic lesions identified. Electronically signed by: Oneil Devonshire MD 03/21/2024 03:28 AM EST RP Workstation: [id number]   MR PELVIS W WO CONTRAST Result Date: 03/20/2024 EXAM: MRI OF THE PELVIS WITH AND WITHOUT INTRAVENOUS CONTRAST 03/20/2024 05:09:16 PM TECHNIQUE: Multiplanar magnetic resonance images of the pelvis with and without intravenous contrast. COMPARISON: None available. CLINICAL HISTORY: Aggressive right acetabular lesion on radiography,  right hip pain. FINDINGS: SOFT TISSUES: Low level adjacent edema tracking along the deep margin of the iliopsoas. Low level edema posteriorly in the left greater trochanter likely due to adjacent gluteus medius tendinopathy. JOINTS: No dislocation or significant effusion. LIMITED INTRAPELVIC CONTENTS: Utricle cyst or mullerian remnant along the posterior superior prostate gland. BONES: MRI confirms an aggressive lytic expansile 3.7 x 2.7 x 3.2 cm mass in the right upper acetabulum with surrounding marrow edema and enhancement. There is cortical demineralization/thinning associated with this lesion. Enhancement along the inferior endplate of L5, please see the dedicated lumbar spine MRI report for further characterization. 0.8 cm mildly enhancing lesion in the ischial tuberosity on image 43 series 8, probably a hemangioma given the T1 signal characteristics, although technically nonspecific due to small size. Metal artifact from intramedullary nail in the right femur. IMPRESSION: 1. Aggressive lytic expansile 3.7 x 2.7 x 3.2 cm mass in the right upper acetabulum with surrounding marrow edema, enhancement, and cortical demineralization/thinning. Lytic metastatic disease or myeloma are the top differential diagnostic considerations, correlate with serologic indicators in assessing for myeloma, and consider further technical imaging in the context of oncology workup. 2. 0.8 cm mildly enhancing lesion in the right ischial tuberosity, probably a hemangioma but technically nonspecific due to small size. Recommend attention on follow-up imaging. 3. Low level edema posteriorly in the left greater trochanter, likely due to adjacent gluteus medius tendinopathy. Electronically signed by: Ryan Salvage MD 03/20/2024  05:37 PM EST RP Workstation: [id number]   MR LUMBAR SPINE WO CONTRAST Result Date: 03/20/2024 EXAM: MRI LUMBAR SPINE 03/20/2024 05:09:16 PM TECHNIQUE: Multiplanar multisequence MRI of the lumbar spine was  performed without the administration of intravenous contrast. COMPARISON: None available. CLINICAL HISTORY: Lumbar radiculopathy with right thigh pain. FINDINGS: BONES AND ALIGNMENT: Normal alignment. Normal vertebral body heights. Congenitally short pedicles in the lumbar spine. Focal inferior endplate irregularity with surrounding marrow edema along the inferior endplate of L5, most resembling an acute Schmorl node; however, given findings in the right acetabulum, the possibility of a metastatic lesion along the inferior endplate of L5 is not totally ruled out. Adjacent type 1 and type 2 degenerative endplate findings noted. Background marrow signal is unremarkable unless otherwise stated. SPINAL CORD: Normal appearing conus medullaris terminates at L1-L2. SOFT TISSUES: No paraspinal mass. LIMITATIONS/ARTIFACTS: Motion artifact is present, reducing diagnostic sensitivity and specificity. DISC DESICCATION: Disc desiccation at L3-L4, L4-L5, and L1-L2. T12-L1: Unremarkable. L1-L2: Disc bulge. No impingement. L2-L3: Disc bulge and mild facet arthropathy. No impingement. L3-L4: Moderate central stenosis due to disc bulge and short pedicles. L4-L5: Moderate central stenosis with moderate right subarticular lateral recess stenosis and moderate right foraminal stenosis due to disc bulge, right lateral recess and foraminal disc protrusion, facet arthropathy, and short pedicles. L5-S1: Borderline bilateral foraminal stenosis due to short pedicles, disc osteophyte complex, and facet arthropathy. IMPRESSION: 1. Moderate central stenosis with moderate right subarticular lateral recess stenosis and moderate right foraminal stenosis at L4-5 due to disc bulge, right lateral recess and foraminal disc protrusion, facet arthropathy, and short pedicles. 2. Moderate central stenosis at L3-4 due to disc bulge and short pedicles. 3. Focal inferior endplate irregularity with surrounding marrow edema along the inferior endplate of L5,  most resembling an acute Schmorl's node; metastatic lesion cannot be completely excluded. Electronically signed by: Ryan Salvage MD 03/20/2024 05:28 PM EST RP Workstation: [id number]   DG Femur Min 2 Views Right Result Date: 03/20/2024 EXAM: 2 VIEW(S) XRAY OF THE FEMUR 03/20/2024 01:05:00 PM COMPARISON: 03/29/2010 CLINICAL HISTORY: hip pain/ h/o femur fracture with hardware FINDINGS: BONES AND JOINTS: Intramedullary rod and screws in place traversing healed fracture deformity of mid femoral diaphysis, with single proximal and 2 distal interlocking screws. Demineralization of the upper medial acetabulum, underlying lytic lesion not excluded. This is further discussed in the hip/pelvis radiograph report. No joint dislocation. SOFT TISSUES: The soft tissues are unremarkable. IMPRESSION: 1. Demineralization of the upper medial right acetabulum with possible underlying lytic lesion; refer to today's hip/pelvis radiograph report for further discussion. 2. Intramedullary nail traversing healed mid femoral diaphyseal fracture, unchanged hardware alignment. Electronically signed by: Ryan Salvage MD 03/20/2024 02:46 PM EST RP Workstation: [id number]   DG Hip Unilat With Pelvis 2-3 Views Right Result Date: 03/20/2024 EXAM: 2 or more VIEW(S) XRAY OF THE HIP 03/20/2024 01:05:00 PM COMPARISON: 03/29/2010 CLINICAL HISTORY: hip pain/ h/o femur fracture with hardware FINDINGS: BONES AND JOINTS: Retrograde intramedullary nail in the right femur. Abnormal 2.1 cm lucency in the right upper medial acetabulum with demineralization of the cortical margin along the acetabulum concerning for a possible lytic lesion. Lytic osseous metastatic disease or myeloma not excluded. Dedicated MRI of the bony pelvis is recommended for more complete assessment. Mild degenerative chondral thinning in both hips. The hip joint is maintained. SOFT TISSUES: The soft tissues are unremarkable. IMPRESSION: 1. Abnormal 2.1 cm lucency in the  right upper medial acetabulum with cortical demineralization, concerning for a lytic lesion; lytic osseous metastatic disease or myeloma  not excluded, recommend dedicated MRI of the bony pelvis for further assessment. 2. Mild degenerative chondral thinning in both hips. Electronically signed by: Ryan Salvage MD 03/20/2024 02:43 PM EST RP Workstation: [id number]   DG Wrist Complete Right Result Date: 03/20/2024 EXAM: 3 OR MORE VIEW(S) XRAY OF THE WRIST 03/20/2024 12:16:00 PM COMPARISON: None available. CLINICAL HISTORY: pain swelling FINDINGS: BONES AND JOINTS: No acute fracture. No focal osseous lesion. No joint dislocation. SOFT TISSUES: Mild soft tissue swelling. IMPRESSION: 1. Mild soft tissue swelling. 2. No acute fracture or dislocation. Electronically signed by: Donnice Mania MD 03/20/2024 12:49 PM EST RP Workstation: [id number]     PATHOLOGY:  Bone marrow biopsy 03/28/24:   - Kappa restricted plasma cell neoplasm consistent with plasma cell myeloma involving approximately 70% of an otherwise normo to mildly hypo-cellular bone marrow (30%) with orderly trilineage hematopoiesis.  PERIPHERAL BLOOD: - Overall unremarkable indices and morphologic smear review.  MICROSCOPIC DESCRIPTION:  PERIPHERAL BLOOD SMEAR: The peripheral blood smear and indices are revealed revealing essentially normal indices and an unremarkable morphologic smear review including negative for rouleaux and circulating plasma cells.  BONE MARROW ASPIRATE: The bone marrow aspirate smear slide contains several bone marrow spicules which are adequate for interpretation. Erythroid precursors: Erythroid series is present in relative to absolute decreased number but has otherwise apparent orderly maturation and unremarkable morphology. Granulocytic precursors: The myeloid series is likewise present in relative to absolute decreased number but with otherwise orderly maturation and unremarkable  morphology. Megakaryocytes: Megakaryocytes are present in adequate number with unremarkable morphology. Lymphocytes/plasma cells: Plasma cells are significantly increased comprising greater than 50% of the marrow (52% on a 500 cell count. While the majority of the plasma cells are small round and mature there are some that show a prominent nucleoli.  In addition there are bilobed forms.  TOUCH PREPARATIONS: The touch prep shows similar but suboptimal morphology compared to the aspirate smear slides, refer to above.  CLOT AND BIOPSY: The decalcified bone marrow biopsy consists of a core of cortical and trabecular bone with a cellularity ranging from 10 to 50% with an overall cellularity of approximately 30% which is overall normal to mildly hypocellular for age.  There is a predominantly interstitial infiltrate of plasma cells with clustering throughout the marrow.  Many of these plasma cells have some plasmablastic type morphology with prominent cherry-red nucleoli.  The background hematopoietic marrow is decreased overall but has otherwise orderly trilineage hematopoiesis.  The clot section consists of periosteum bony fragments and a scant amount of hematopoietic marrow but within otherwise similar morphology as to the biopsy, refer to above  IMMUNOHISTOCHEMICAL STAINS: Immunohistochemical stains are performed on the biopsy with appropriate controls.  CD138/MUM1 highlights significantly increased plasma cells in a predominantly interstitial pattern that comprise approximately 70% of the overall cellular marrow. These plasma cells are kappa restricted by kappa/lambda ISH.  They aberrantly stain with CD56, are negative for cyclin D1 and overall while there may be a small weak subset positive for CD117 and is overall interpreted as negative.  IRON STAIN: Iron stains are performed on a bone marrow aspirate or touch imprint smear and section of clot. The controls stained  appropriately.       Storage Iron: Adequate histiocytic iron stores      Ring Sideroblasts: Not identified  ADDITIONAL DATA/TESTING: Conventional cytogenetics and plasma cell myeloma prognostic panel FISH are pending and will be reported separately.  CELL COUNT DATA:  Bone Marrow count performed on 500 cells shows: Blasts:   0%  Myeloid:  24% Promyelocytes: 0%   Erythroid:     8% Myelocytes:    8%   Lymphocytes:   14% Metamyelocytes:     0%   Plasma cells:  52% Bands:    3% Neutrophils:   13%  M:E ratio:     3.00 Eosinophils:   0% Basophils:     0% Monocytes:     2%   FISH Panel     R acetabular biopsy 03/26/24:  FINAL MICROSCOPIC DIAGNOSIS:   A. RIGHT ACETABULAR LYTIC LESION, BONE BIOPSY:  Plasma cell neoplasm exhibiting kappa light chain restriction (see  comment)    IMPRESSION/PLAN:  1. Lytic bone lesion:    - Aaron Willis is a 63 y.o. male with multiple myeloma involving the R acetabulum    - Currently symptomatic with 6/10 pain and difficulty with ambulation.    - Has seen orthopedic surgery who did not think prophylactic fixation was indicated but unclear if further evaluation was being sought from ortho trauma - will consider referral to ortho onc for further eval     - We reviewed the role of palliative radiation therapy for local control, pain relief, and prevention of morbidity.     - Discussed expected response rates, onset of benefit, and possible side effects including skin changes, fatigue and mild bladder irritation    - Discussed standard fractionation schedules (e.g., 8 Gy  1, 20 Gy in 5, 20 Gy in 10, 30 Gy in 10).    - Our recommendation today is 20 Gy in 10 fractions.    - Patient wishes to proceed.    - Simulation scheduled for next Tuesday.  2. Systemic therapy:    - Coordinated care with medical oncology.     - Patient will start systemic therapy after radiation completion    - He is a candidate for a bone marrow transplant and is being  referred to [facility] for evaluation  3. Pain management:    - Current regimen: tramadol .    - Continue current pain regimen, discussed possible need for steroids if patient develops pain flare  Total time spent today in preparation for this visit was 60 minutes. This included patient care, imaging and path review, documentation, multidisciplinary discussion and coordination of care and follow up.    Estefana HERO. Maritza, M.D.

## 2024-04-11 NOTE — Progress Notes (Signed)
 Histology and Location of Primary Cancer:   Sites of Visceral and Bony Metastatic Disease: Right hip pain     Past/Anticipated chemotherapy by medical oncology, if any: Yes  Pain on a scale of 0-10 is: 6    If Spine Met(s), symptoms, if any, include: Bowel/Bladder retention or incontinence (please describe): Denies Numbness or weakness in extremities (please describe): Denies  Current Decadron regimen, if applicable: Denies  Ambulatory status? Walker? Wheelchair?: Crutches  SAFETY ISSUES: Prior radiation? No Pacemaker/ICD? No Possible current pregnancy? Patient is male Is the patient on methotrexate? No  Current Complaints / other details:  Patient is eager to get started.    BP (!) 142/91   Pulse 93   Temp (!) 96.9 F (36.1 C)   Resp 18   Ht 5' 6 (1.676 m)   Wt 212 lb 6.4 oz (96.3 kg)   SpO2 98%   BMI 34.28 kg/m

## 2024-04-12 ENCOUNTER — Telehealth: Payer: Self-pay | Admitting: Radiation Oncology

## 2024-04-12 NOTE — Telephone Encounter (Signed)
 12/18 Called [facility] dept to confirm fax number [phone].  Sent via stat fax requested for recent MRI/CT CAP images to be pushed to [facility]hare at [facility] -per Dr. Maritza.

## 2024-04-13 ENCOUNTER — Encounter: Payer: Self-pay | Admitting: Hematology

## 2024-04-13 ENCOUNTER — Telehealth: Payer: Self-pay

## 2024-04-13 ENCOUNTER — Other Ambulatory Visit: Payer: Self-pay

## 2024-04-13 ENCOUNTER — Inpatient Hospital Stay: Admitting: Hematology

## 2024-04-13 ENCOUNTER — Ambulatory Visit: Admitting: Radiation Oncology

## 2024-04-13 ENCOUNTER — Encounter (HOSPITAL_COMMUNITY)
Admission: RE | Admit: 2024-04-13 | Discharge: 2024-04-13 | Disposition: A | Discharge status: Home / Self Care | Source: Ambulatory Visit | Attending: Hematology | Admitting: Hematology

## 2024-04-13 DIAGNOSIS — C9000 Multiple myeloma not having achieved remission: Secondary | ICD-10-CM | POA: Diagnosis not present

## 2024-04-13 LAB — GLUCOSE, CAPILLARY: Glucose-Capillary: 120 mg/dL — ABNORMAL HIGH (ref 70–99)

## 2024-04-13 MED ORDER — ONDANSETRON HCL 8 MG PO TABS: 8.0000 mg | ORAL_TABLET | Freq: Three times a day (TID) | ORAL | 1 refills | Status: DC | PRN

## 2024-04-13 MED ORDER — FLUDEOXYGLUCOSE F - 18 (FDG) INJECTION
11.0000 | Freq: Once | INTRAVENOUS | Status: AC
  Administered 2024-04-13: 10.5 via INTRAVENOUS

## 2024-04-13 MED ORDER — PROCHLORPERAZINE MALEATE 10 MG PO TABS: 10.0000 mg | ORAL_TABLET | Freq: Four times a day (QID) | ORAL | 1 refills | Status: DC | PRN

## 2024-04-13 MED ORDER — ACYCLOVIR 400 MG PO TABS: 400.0000 mg | ORAL_TABLET | Freq: Two times a day (BID) | ORAL | 5 refills | Status: DC

## 2024-04-13 MED ORDER — ASPIRIN 81 MG PO TBEC: 81.0000 mg | DELAYED_RELEASE_TABLET | Freq: Every day | ORAL | 3 refills | Status: DC

## 2024-04-13 NOTE — Assessment & Plan Note (Signed)
 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 Pt presented with severe right hip pain and CT and MRI showed a lytic bone lesion in right acetabular.  CT-guided bone biopsy showed plasma neoplasm. -Multiple myeloma panel showed IgG kappa light chain M protein 1.2, CBC and CMP were unremarkable. - He underwent a bone marrow biopsy subsequently, which showed 70% plasma cells, cytogenetics was normal, FISH panel showed 13 q. deletion, gain of 1 q. and 11/11 q, this is a standard risk. - He is a candidate for bone marrow transplant, I recommend first-line chemotherapy daratumumab, Revlimid, Velcade and dexamethasone. - Will refer him to radiation oncology for palliative radiation to the right acetabular bone lesion.

## 2024-04-13 NOTE — Progress Notes (Signed)
 " [facility]   Telephone:(336) [phone] Fax:(336) [phone]   Clinic Follow up Note   [facility] Care Team: [facility] - General (General Practice) 04/13/2024  I connected with Aaron Willis on 04/13/2024 at  4:00 PM EST by telephone and verified that I am speaking with the correct person using two identifiers.   I discussed the limitations, risks, security and privacy concerns of performing an evaluation and management service by telephone and the availability of in person appointments. I also discussed with the patient that there may be a patient responsible charge related to this service. The patient expressed understanding and agreed to proceed.   Patient's location:  Home  Provider's location:  Office    CHIEF COMPLAINT: f/u PET    CURRENT THERAPY: pending surgery and RT  Oncology history Multiple myeloma (HCC) RISS stage I, standard risk  -diagnosed in 02/2024. Pt presented with severe right hip pain and CT and MRI showed a lytic bone lesion in right acetabular.  CT-guided bone biopsy showed plasma neoplasm. -Multiple myeloma panel showed IgG kappa light chain M protein 1.2, CBC and CMP were unremarkable. - He underwent a bone marrow biopsy subsequently, which showed 70% plasma cells, cytogenetics was normal, FISH panel showed 13 q. deletion, gain of 1 q. and 11/11 q, this is a standard risk. - He is a candidate for bone marrow transplant, I recommend first-line chemotherapy daratumumab, Revlimid, Velcade and dexamethasone. - Will refer him to radiation oncology for palliative radiation to the right acetabular bone lesion.   Assessment & Plan Multiple myeloma  Newly diagnosed multiple myeloma with PET-avid pelvic bone lesion and no evidence of other bone lesions. Hematologic parameters remain stable without bone marrow compromise. Disease management is proceeding in a coordinated, stepwise approach with surgical evaluation and surgery to prevent  fracture, planned local radiation for pain control, and subsequent systemic therapy. The interval prior to chemotherapy initiation is not expected to increase risk, given stable blood counts and absence of acute symptoms. - Cancelled upcoming hematology/oncology appointment to facilitate surgical consultation at [facility]. - Coordinated scheduling and confirmed all appointment dates with him. - Planned surgical evaluation and possible procedure at [facility] next week. - Scheduled radiation therapy to begin April 23, 2024, for three weeks, ending May 11, 2024. - Planned initiation of chemotherapy regimen (Revlimid, steroid, Dara and Velcade injections) on May 14, 2024, following completion of radiation therapy. - Rescheduled chemotherapy education class prior to chemotherapy initiation. - Planned laboratory monitoring with blood tests during the week of December 29 and again on January 19 prior to chemotherapy, with continued weekly monitoring until treatment initiation.  Plan - PET scan reviewed - Patient has been referred to [facility] orthopedics, appointment next Monday and surgery next Tuesday - He is scheduled to start palliative radiation on December 29, completed on January 16 - To start chemotherapy on January 19 - Reschedule his chemo class - Follow-up with lab every 2 to 3 weeks for monitoring    SUMMARY OF ONCOLOGIC HISTORY: Oncology History  Multiple myeloma (HCC)  03/26/2024 Cancer Staging   Staging form: Plasma Cell Myeloma and Plasma Cell Disorders, AJCC 8th Edition - Clinical stage from 03/26/2024: RISS Stage I (Beta-2-microglobulin (mg/L): 2.2, Albumin (g/dL): 3.6, ISS: Stage I, High-risk cytogenetics: Absent, LDH: Normal) - Signed by Lanny Callander, MD on 04/09/2024 Stage prefix: Initial diagnosis Beta 2 microglobulin range (mg/L): Less than 3.5 Albumin range (g/dL): Greater than or equal to 3.5 Cytogenetics: 1q addition,  Other mutation   04/09/2024 Initial  Diagnosis   Multiple myeloma (HCC)   05/14/2024 -  Chemotherapy   Patient is on Treatment Plan : MYELOMA NEWLY DIAGNOSED TRANSPLANT CANDIDATE DaraVRd (Daratumumab SQ) with biweekly bortezomib (D1,4,8,11) q21d x 6 Cycles (Induction/Consolidation)       Discussed the use of AI scribe software for clinical note transcription with the patient, who gave verbal consent to proceed.  History of Present Illness Aaron Willis is a 63 year old male with newly diagnosed multiple myeloma who presents for hematology/oncology follow-up and coordination of upcoming treatments.  He was recently diagnosed with multiple myeloma with PET imaging showing a positive lesion in the pelvic bone and no other new lesions.  His most recent laboratory evaluation on March 28, 2024, was unremarkable with stable blood counts.  He is coordinating care with hematology/oncology and surgical teams for potential intervention.  He is concerned about delaying chemotherapy. He was reassured that his current disease status and stable counts do not require immediate therapy and that close laboratory monitoring will continue. He also asked about diabetes medication timing with vaccination as discussed with his primary care provider.     REVIEW OF SYSTEMS:   Constitutional: Denies fevers, chills or abnormal weight loss Eyes: Denies blurriness of vision Ears, nose, mouth, throat, and face: Denies mucositis or sore throat Respiratory: Denies cough, dyspnea or wheezes Cardiovascular: Denies palpitation, chest discomfort or lower extremity swelling Gastrointestinal:  Denies nausea, heartburn or change in bowel habits Skin: Denies abnormal skin rashes Lymphatics: Denies new lymphadenopathy or easy bruising Neurological:Denies numbness, tingling or new weaknesses Behavioral/Psych: Mood is stable, no new changes  All other systems were reviewed with the patient and are negative.  MEDICAL HISTORY:  Past Medical History:   Diagnosis Date   Diabetes mellitus without complication (HCC)    Hypertension     SURGICAL HISTORY: Past Surgical History:  Procedure Laterality Date   FEMUR FRACTURE SURGERY Right    IR BONE MARROW BIOPSY & ASPIRATION  03/28/2024   KNEE SURGERY Left     I have reviewed the social history and family history with the patient and they are unchanged from previous note.  ALLERGIES:  has no active allergies.  MEDICATIONS:  Current Outpatient Medications  Medication Sig Dispense Refill   acetaminophen  (TYLENOL ) 500 MG tablet Take 500 mg by mouth every 6 (six) hours as needed.     atorvastatin  (LIPITOR) 40 MG tablet Take 40 mg by mouth daily.     Empagliflozin-metFORMIN HCl ER 25-1000 MG TB24 Take 1 tablet by mouth daily.     fluticasone  (FLONASE ) 50 MCG/ACT nasal spray Place 2 sprays into both nostrils daily. (Patient taking differently: Place 2 sprays into both nostrils daily as needed for allergies.) 16 g 6   ibuprofen  (ADVIL ) 600 MG tablet Take 1 tablet (600 mg total) by mouth every 8 (eight) hours as needed.     losartan  (COZAAR ) 100 MG tablet Take 50 mg by mouth daily.     pantoprazole  (PROTONIX ) 40 MG tablet Take 1 tablet (40 mg total) by mouth daily. 30 tablet 0   traMADol  (ULTRAM ) 50 MG tablet Take 1 tablet (50 mg total) by mouth every 6 (six) hours as needed. 30 tablet 0   No current facility-administered medications for this visit.    PHYSICAL EXAMINATION: Not performed   LABORATORY DATA:  I have reviewed the data as listed    Latest Ref Rng & Units 03/28/2024   10:00 AM 03/27/2024    4:55 AM  03/26/2024    4:03 AM  CBC  WBC 4.0 - 10.5 K/uL 8.9  9.7  9.0   Hemoglobin 13.0 - 17.0 g/dL 85.9  86.8  86.7   Hematocrit 39.0 - 52.0 % 42.6  39.2  39.5   Platelets 150 - 400 K/uL 332  263  249         Latest Ref Rng & Units 03/27/2024    4:55 AM 03/26/2024    4:03 AM 03/25/2024    4:21 AM  CMP  Glucose 70 - 99 mg/dL 847  863  858   BUN 8 - 23 mg/dL 15  15  15     Creatinine 0.61 - 1.24 mg/dL 8.95  8.90  8.87   Sodium 135 - 145 mmol/L 135  136  137   Potassium 3.5 - 5.1 mmol/L 4.5  4.3  4.5   Chloride 98 - 111 mmol/L 99  98  99   CO2 22 - 32 mmol/L 27  28  28    Calcium  8.9 - 10.3 mg/dL 9.6  9.2  9.1   Total Protein 6.5 - 8.1 g/dL   7.4   Total Bilirubin 0.0 - 1.2 mg/dL   0.5   Alkaline Phos 38 - 126 U/L   64   AST 15 - 41 U/L   59   ALT 0 - 44 U/L   50       RADIOGRAPHIC STUDIES: I have personally reviewed the radiological images as listed and agreed with the findings in the report. NM PET Image Initial (PI) Whole Body (F-18 FDG) Result Date: 04/13/2024 CLINICAL DATA:  Initial treatment strategy for multiple myeloma. EXAM: NUCLEAR MEDICINE PET WHOLE BODY TECHNIQUE: 10.5 mCi F-18 FDG was injected intravenously. Full-ring PET imaging was performed from the head to foot after the radiotracer. CT data was obtained and used for attenuation correction and anatomic localization. Fasting blood glucose: 120 mg/dl COMPARISON:  88/73/7974 chest abdomen and pelvic CT. FINDINGS: Mediastinal blood pool activity: SUV max 2.5 HEAD/NECK: No areas of abnormal hypermetabolism. Incidental CT findings: No cervical adenopathy. CHEST: No pulmonary parenchymal or thoracic nodal hypermetabolism. Incidental CT findings: Mild cardiomegaly. Aortic valve calcification. ABDOMEN/PELVIS: Mild diffuse gastric hypermetabolism at a S.U.V. max of 5.1. No abdominopelvic nodal hypermetabolism. Incidental CT findings: mild left adrenal thickening. Abdominal aortic atherosclerosis. Midline cystic lesion within the prostate of 1.3 cm on image 197/4. Apparent bladder wall thickening is likely due to underdistention. SKELETON: Lytic right acetabular lesion measures 3.5 cm and SUV 3.6 on image 190/4. No other lytic or hypermetabolic lesions identified. Incidental CT findings: none EXTREMITIES: Mild hypermetabolism and synovial thickening involving the left knee, likely degenerative. No suspicious  lytic or hypermetabolic lesions identified. Incidental CT findings: Right femur fixation with proximal and distal locking screws. IMPRESSION: 1. Isolated mildly hypermetabolic right acetabular lesion. No other sites of myeloma identified. 2. Mild diffuse gastric hypermetabolism is suspicious for gastritis. 3. Aortic valvular calcifications. Consider echocardiography to evaluate for valvular dysfunction. 4. 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 Electronically Signed   By: Rockey Kilts M.D.   On: 04/13/2024 10:15       I discussed the assessment and treatment plan with the patient. The patient was provided an opportunity to ask questions and all were answered. The patient agreed with the plan and demonstrated an understanding of the instructions.   The patient was advised to call back or seek an in-person evaluation if the symptoms worsen  or if the condition fails to improve as anticipated.  I provided 40 minutes of non face-to-face telephone visit time during this encounter, including review of chart and various tests results, discussions about plan of care and coordination of care plan.    Onita Mattock, MD 04/13/2024     "

## 2024-04-13 NOTE — Telephone Encounter (Signed)
 Pt called inquiring if he could get a shingles and pneumonia vaccine.  Spoke with Dr Lanny and she said yes the pt can get both vaccines.  Pt verbalized understanding.

## 2024-04-13 NOTE — Progress Notes (Signed)
 This encounter was created in error - please disregard.

## 2024-04-13 NOTE — Telephone Encounter (Signed)
 Pt called stating his Orthopedic surgeon's office at [facility] (Dr Alvino) called to schedule him for a pre-op appt on 04/16/2024 and surgery on 04/17/2024.  Pt wanted to make Dr Demetra office aware and request Dr Lanny to give him a call should she need to speak with him.

## 2024-04-14 ENCOUNTER — Other Ambulatory Visit: Payer: Self-pay

## 2024-04-16 ENCOUNTER — Inpatient Hospital Stay

## 2024-04-16 ENCOUNTER — Inpatient Hospital Stay: Admitting: Hematology

## 2024-04-16 ENCOUNTER — Other Ambulatory Visit: Payer: Self-pay

## 2024-04-16 ENCOUNTER — Telehealth: Payer: Self-pay | Admitting: Hematology

## 2024-04-16 NOTE — Progress Notes (Signed)
 Faxed Dental Clearance letter to pt's Dentist [facility] [phone]  [phone]).  Dr Lanny plans to start the pt on Zometa in January 2026.  Fax confirmation received.

## 2024-04-17 ENCOUNTER — Other Ambulatory Visit: Payer: Self-pay | Admitting: Hematology

## 2024-04-17 ENCOUNTER — Ambulatory Visit: Admitting: Radiation Oncology

## 2024-04-18 NOTE — Telephone Encounter (Signed)
-   Follow up: Patient to follow up with Orthopaedic Trauma APP in 2-3 weeks. Dr. Halvorson in 6 weeks.    Please call to schedule this patient's follow up.  XR needed at follow up include: AP/Judet views of the pelvis and right acetabulum at 6 weeks.   Thank you! Dyana LITTIE Bucco, MD Resident - [facility]

## 2024-04-23 ENCOUNTER — Ambulatory Visit
Admission: RE | Admit: 2024-04-23 | Discharge: 2024-04-23 | Disposition: A | Discharge status: Home / Self Care | Source: Ambulatory Visit | Attending: Radiation Oncology | Admitting: Radiation Oncology

## 2024-04-23 DIAGNOSIS — C9000 Multiple myeloma not having achieved remission: Secondary | ICD-10-CM | POA: Diagnosis present

## 2024-04-23 DIAGNOSIS — Z510 Encounter for antineoplastic radiation therapy: Secondary | ICD-10-CM | POA: Insufficient documentation

## 2024-04-24 ENCOUNTER — Other Ambulatory Visit: Payer: Self-pay

## 2024-04-24 ENCOUNTER — Telehealth: Payer: Self-pay

## 2024-04-24 NOTE — Telephone Encounter (Signed)
 Contacted [facility] regarding dental clearance form faxed to them on 04/16/2024.  Receptionist stated the form was faxed back on 04/17/2024.  Stated form was not received and provided the correct fax number for returned fax.  Receptionist stated she would refax to correct fax number.  Call ended.    Dental Clearance fax received and scanned to the pt's chart.  Pt was cleared by Bernice Molly DDS with [facility] for Zometa.  Dental clearance sent to HIM to be scanned in pt's chart.

## 2024-04-25 ENCOUNTER — Ambulatory Visit: Admitting: Radiation Oncology

## 2024-04-25 ENCOUNTER — Telehealth: Payer: Self-pay

## 2024-04-25 DIAGNOSIS — Z510 Encounter for antineoplastic radiation therapy: Secondary | ICD-10-CM | POA: Diagnosis not present

## 2024-04-25 NOTE — Telephone Encounter (Signed)
 Spoke with pt to let him know that we received his form in our office and that I will be sending him an ROI form him to sign. Pt verbalized understanding.

## 2024-04-26 ENCOUNTER — Encounter: Payer: Self-pay | Admitting: Hematology

## 2024-04-27 ENCOUNTER — Other Ambulatory Visit: Payer: Self-pay

## 2024-04-27 ENCOUNTER — Encounter: Payer: Self-pay | Admitting: Hematology

## 2024-04-29 ENCOUNTER — Other Ambulatory Visit: Payer: Self-pay

## 2024-04-30 ENCOUNTER — Other Ambulatory Visit: Payer: Self-pay

## 2024-04-30 ENCOUNTER — Ambulatory Visit
Admission: RE | Admit: 2024-04-30 | Discharge: 2024-04-30 | Disposition: A | Discharge status: Home / Self Care | Source: Ambulatory Visit | Attending: Radiation Oncology | Admitting: Radiation Oncology

## 2024-04-30 DIAGNOSIS — C9000 Multiple myeloma not having achieved remission: Secondary | ICD-10-CM | POA: Insufficient documentation

## 2024-04-30 DIAGNOSIS — Z510 Encounter for antineoplastic radiation therapy: Secondary | ICD-10-CM | POA: Diagnosis present

## 2024-04-30 LAB — RAD ONC ARIA SESSION SUMMARY
Course Elapsed Days: 0
Plan Fractions Treated to Date: 1
Plan Prescribed Dose Per Fraction: 2 Gy
Plan Total Fractions Prescribed: 10
Plan Total Prescribed Dose: 20 Gy
Reference Point Dosage Given to Date: 2 Gy
Reference Point Session Dosage Given: 2 Gy
Session Number: 1

## 2024-05-01 ENCOUNTER — Ambulatory Visit: Admission: RE | Admit: 2024-05-01 | Discharge: 2024-05-01 | Attending: Radiation Oncology

## 2024-05-01 ENCOUNTER — Other Ambulatory Visit: Payer: Self-pay

## 2024-05-01 ENCOUNTER — Inpatient Hospital Stay

## 2024-05-01 ENCOUNTER — Inpatient Hospital Stay: Attending: Hematology

## 2024-05-01 ENCOUNTER — Telehealth: Payer: Self-pay

## 2024-05-01 DIAGNOSIS — Z510 Encounter for antineoplastic radiation therapy: Secondary | ICD-10-CM | POA: Diagnosis not present

## 2024-05-01 DIAGNOSIS — Z79899 Other long term (current) drug therapy: Secondary | ICD-10-CM | POA: Diagnosis not present

## 2024-05-01 DIAGNOSIS — D630 Anemia in neoplastic disease: Secondary | ICD-10-CM | POA: Diagnosis not present

## 2024-05-01 DIAGNOSIS — C9000 Multiple myeloma not having achieved remission: Secondary | ICD-10-CM | POA: Diagnosis present

## 2024-05-01 DIAGNOSIS — Z923 Personal history of irradiation: Secondary | ICD-10-CM | POA: Diagnosis not present

## 2024-05-01 DIAGNOSIS — E1165 Type 2 diabetes mellitus with hyperglycemia: Secondary | ICD-10-CM | POA: Insufficient documentation

## 2024-05-01 DIAGNOSIS — Z7982 Long term (current) use of aspirin: Secondary | ICD-10-CM | POA: Insufficient documentation

## 2024-05-01 DIAGNOSIS — Z79624 Long term (current) use of inhibitors of nucleotide synthesis: Secondary | ICD-10-CM | POA: Diagnosis not present

## 2024-05-01 DIAGNOSIS — Z7984 Long term (current) use of oral hypoglycemic drugs: Secondary | ICD-10-CM | POA: Insufficient documentation

## 2024-05-01 DIAGNOSIS — Z7969 Long term (current) use of other immunomodulators and immunosuppressants: Secondary | ICD-10-CM | POA: Insufficient documentation

## 2024-05-01 DIAGNOSIS — Z7983 Long term (current) use of bisphosphonates: Secondary | ICD-10-CM | POA: Diagnosis not present

## 2024-05-01 DIAGNOSIS — Z5112 Encounter for antineoplastic immunotherapy: Secondary | ICD-10-CM | POA: Insufficient documentation

## 2024-05-01 LAB — RAD ONC ARIA SESSION SUMMARY
Course Elapsed Days: 1
Plan Fractions Treated to Date: 2
Plan Prescribed Dose Per Fraction: 2 Gy
Plan Total Fractions Prescribed: 10
Plan Total Prescribed Dose: 20 Gy
Reference Point Dosage Given to Date: 4 Gy
Reference Point Session Dosage Given: 2 Gy
Session Number: 2

## 2024-05-01 LAB — CBC WITH DIFFERENTIAL (CANCER CENTER ONLY)
Abs Immature Granulocytes: 0.07 K/uL (ref 0.00–0.07)
Basophils Absolute: 0.1 K/uL (ref 0.0–0.1)
Basophils Relative: 1 %
Eosinophils Absolute: 0.1 K/uL (ref 0.0–0.5)
Eosinophils Relative: 1 %
HCT: 36.3 % — ABNORMAL LOW (ref 39.0–52.0)
Hemoglobin: 12.5 g/dL — ABNORMAL LOW (ref 13.0–17.0)
Immature Granulocytes: 1 %
Lymphocytes Relative: 30 %
Lymphs Abs: 2.6 K/uL (ref 0.7–4.0)
MCH: 32.8 pg (ref 26.0–34.0)
MCHC: 34.4 g/dL (ref 30.0–36.0)
MCV: 95.3 fL (ref 80.0–100.0)
Monocytes Absolute: 0.6 K/uL (ref 0.1–1.0)
Monocytes Relative: 7 %
Neutro Abs: 5.1 K/uL (ref 1.7–7.7)
Neutrophils Relative %: 60 %
Platelet Count: 425 K/uL — ABNORMAL HIGH (ref 150–400)
RBC: 3.81 MIL/uL — ABNORMAL LOW (ref 4.22–5.81)
RDW: 14.9 % (ref 11.5–15.5)
WBC Count: 8.5 K/uL (ref 4.0–10.5)
nRBC: 0 % (ref 0.0–0.2)

## 2024-05-01 LAB — TYPE AND SCREEN
ABO/RH(D): O POS
Antibody Screen: NEGATIVE

## 2024-05-01 LAB — CMP (CANCER CENTER ONLY)
ALT: 41 U/L (ref 0–44)
AST: 24 U/L (ref 15–41)
Albumin: 3.8 g/dL (ref 3.5–5.0)
Alkaline Phosphatase: 77 U/L (ref 38–126)
Anion gap: 7 (ref 5–15)
BUN: 12 mg/dL (ref 8–23)
CO2: 28 mmol/L (ref 22–32)
Calcium: 9.5 mg/dL (ref 8.9–10.3)
Chloride: 103 mmol/L (ref 98–111)
Creatinine: 1.02 mg/dL (ref 0.61–1.24)
GFR, Estimated: >60 mL/min
Glucose, Bld: 97 mg/dL (ref 70–99)
Potassium: 4.1 mmol/L (ref 3.5–5.1)
Sodium: 137 mmol/L (ref 135–145)
Total Bilirubin: 0.3 mg/dL (ref 0.0–1.2)
Total Protein: 7.8 g/dL (ref 6.5–8.1)

## 2024-05-01 LAB — PRETREATMENT RBC PHENOTYPE

## 2024-05-01 NOTE — Telephone Encounter (Signed)
 Patient calling to confirm dental clearance forms were received.  Let patient know that we did receive the form, the form has been completed, and faxed to [facility] F# [phone]. Confirmation received. Patient voiced understanding.

## 2024-05-02 ENCOUNTER — Other Ambulatory Visit: Payer: Self-pay

## 2024-05-02 ENCOUNTER — Ambulatory Visit
Admission: RE | Admit: 2024-05-02 | Discharge: 2024-05-02 | Disposition: A | Discharge status: Home / Self Care | Source: Ambulatory Visit | Attending: Radiation Oncology | Admitting: Radiation Oncology

## 2024-05-02 DIAGNOSIS — Z510 Encounter for antineoplastic radiation therapy: Secondary | ICD-10-CM | POA: Diagnosis not present

## 2024-05-02 LAB — RAD ONC ARIA SESSION SUMMARY
Course Elapsed Days: 2
Plan Fractions Treated to Date: 3
Plan Prescribed Dose Per Fraction: 2 Gy
Plan Total Fractions Prescribed: 10
Plan Total Prescribed Dose: 20 Gy
Reference Point Dosage Given to Date: 6 Gy
Reference Point Session Dosage Given: 2 Gy
Session Number: 3

## 2024-05-02 LAB — KAPPA/LAMBDA LIGHT CHAINS
Kappa free light chain: 99.1 mg/L — ABNORMAL HIGH (ref 3.3–19.4)
Kappa, lambda light chain ratio: 11.94 — ABNORMAL HIGH (ref 0.26–1.65)
Lambda free light chains: 8.3 mg/L (ref 5.7–26.3)

## 2024-05-03 ENCOUNTER — Ambulatory Visit
Admission: RE | Admit: 2024-05-03 | Discharge: 2024-05-03 | Disposition: A | Discharge status: Home / Self Care | Source: Ambulatory Visit | Attending: Radiation Oncology | Admitting: Radiation Oncology

## 2024-05-03 ENCOUNTER — Other Ambulatory Visit: Payer: Self-pay

## 2024-05-03 DIAGNOSIS — Z510 Encounter for antineoplastic radiation therapy: Secondary | ICD-10-CM | POA: Diagnosis not present

## 2024-05-03 LAB — RAD ONC ARIA SESSION SUMMARY
Course Elapsed Days: 3
Plan Fractions Treated to Date: 4
Plan Prescribed Dose Per Fraction: 2 Gy
Plan Total Fractions Prescribed: 10
Plan Total Prescribed Dose: 20 Gy
Reference Point Dosage Given to Date: 8 Gy
Reference Point Session Dosage Given: 2 Gy
Session Number: 4

## 2024-05-04 ENCOUNTER — Ambulatory Visit
Admission: RE | Admit: 2024-05-04 | Discharge: 2024-05-04 | Disposition: A | Source: Ambulatory Visit | Attending: Radiation Oncology

## 2024-05-04 ENCOUNTER — Other Ambulatory Visit: Payer: Self-pay

## 2024-05-04 DIAGNOSIS — Z510 Encounter for antineoplastic radiation therapy: Secondary | ICD-10-CM | POA: Diagnosis not present

## 2024-05-04 LAB — RAD ONC ARIA SESSION SUMMARY
Course Elapsed Days: 4
Plan Fractions Treated to Date: 5
Plan Prescribed Dose Per Fraction: 2 Gy
Plan Total Fractions Prescribed: 10
Plan Total Prescribed Dose: 20 Gy
Reference Point Dosage Given to Date: 10 Gy
Reference Point Session Dosage Given: 2 Gy
Session Number: 5

## 2024-05-05 LAB — MULTIPLE MYELOMA PANEL, SERUM
Albumin SerPl Elph-Mcnc: 3.1 g/dL (ref 2.9–4.4)
Albumin/Glob SerPl: 0.9 (ref 0.7–1.7)
Alpha 1: 0.3 g/dL (ref 0.0–0.4)
Alpha2 Glob SerPl Elph-Mcnc: 1 g/dL (ref 0.4–1.0)
B-Globulin SerPl Elph-Mcnc: 0.9 g/dL (ref 0.7–1.3)
Gamma Glob SerPl Elph-Mcnc: 1.6 g/dL (ref 0.4–1.8)
Globulin, Total: 3.8 g/dL (ref 2.2–3.9)
IgA: 32 mg/dL — ABNORMAL LOW (ref 61–437)
IgG (Immunoglobin G), Serum: 2215 mg/dL — ABNORMAL HIGH (ref 603–1613)
IgM (Immunoglobulin M), Srm: 45 mg/dL (ref 20–172)
M Protein SerPl Elph-Mcnc: 1.5 g/dL — ABNORMAL HIGH
Total Protein ELP: 6.9 g/dL (ref 6.0–8.5)

## 2024-05-07 ENCOUNTER — Telehealth: Payer: Self-pay

## 2024-05-07 ENCOUNTER — Ambulatory Visit
Admission: RE | Admit: 2024-05-07 | Discharge: 2024-05-07 | Disposition: A | Source: Ambulatory Visit | Attending: Radiation Oncology

## 2024-05-07 ENCOUNTER — Other Ambulatory Visit: Payer: Self-pay

## 2024-05-07 DIAGNOSIS — Z510 Encounter for antineoplastic radiation therapy: Secondary | ICD-10-CM | POA: Diagnosis not present

## 2024-05-07 LAB — RAD ONC ARIA SESSION SUMMARY
Course Elapsed Days: 7
Plan Fractions Treated to Date: 6
Plan Prescribed Dose Per Fraction: 2 Gy
Plan Total Fractions Prescribed: 10
Plan Total Prescribed Dose: 20 Gy
Reference Point Dosage Given to Date: 12 Gy
Reference Point Session Dosage Given: 2 Gy
Session Number: 6

## 2024-05-07 NOTE — Telephone Encounter (Signed)
 LVM for pt in regards to his form being completed,faxed and confirmation received.

## 2024-05-08 ENCOUNTER — Other Ambulatory Visit: Payer: Self-pay

## 2024-05-08 ENCOUNTER — Ambulatory Visit
Admission: RE | Admit: 2024-05-08 | Discharge: 2024-05-08 | Disposition: A | Source: Ambulatory Visit | Attending: Radiation Oncology

## 2024-05-08 DIAGNOSIS — Z510 Encounter for antineoplastic radiation therapy: Secondary | ICD-10-CM | POA: Diagnosis not present

## 2024-05-08 LAB — RAD ONC ARIA SESSION SUMMARY
Course Elapsed Days: 8
Plan Fractions Treated to Date: 7
Plan Prescribed Dose Per Fraction: 2 Gy
Plan Total Fractions Prescribed: 10
Plan Total Prescribed Dose: 20 Gy
Reference Point Dosage Given to Date: 14 Gy
Reference Point Session Dosage Given: 2 Gy
Session Number: 7

## 2024-05-09 ENCOUNTER — Ambulatory Visit
Admission: RE | Admit: 2024-05-09 | Discharge: 2024-05-09 | Disposition: A | Discharge status: Home / Self Care | Source: Ambulatory Visit | Attending: Radiation Oncology | Admitting: Radiation Oncology

## 2024-05-09 ENCOUNTER — Ambulatory Visit

## 2024-05-09 ENCOUNTER — Other Ambulatory Visit: Payer: Self-pay

## 2024-05-09 DIAGNOSIS — Z510 Encounter for antineoplastic radiation therapy: Secondary | ICD-10-CM | POA: Diagnosis not present

## 2024-05-09 LAB — RAD ONC ARIA SESSION SUMMARY
Course Elapsed Days: 9
Plan Fractions Treated to Date: 8
Plan Prescribed Dose Per Fraction: 2 Gy
Plan Total Fractions Prescribed: 10
Plan Total Prescribed Dose: 20 Gy
Reference Point Dosage Given to Date: 16 Gy
Reference Point Session Dosage Given: 2 Gy
Session Number: 8

## 2024-05-09 NOTE — Progress Notes (Signed)
 Patient Name: Aaron Willis MR#: [id number] DOB:  12-17-1960 Age:  64 y.o. Date: 05/09/2024 Author: Roselie Alan Merritts, AGNP   Chief Complaint: Post-op of the Right Hip (s/p ORIF pathologic fracture right acetabulum, DOS: 04/18/24/)   SUBJECTIVE   Patient ID: Aaron Willis is a 64 y.o. male  has no past medical history on file.. Aaron Willis presents today for Post-op of the Right Hip (s/p ORIF pathologic fracture right acetabulum, DOS: 04/18/24/)   Patient presents for routine 3 week follow up. They have been compliant with WBAT status. They have started physical therapy. They have been compliant with Aspirin  for DVT prophylaxis. Pain is well controlled. Patient denies fever, chills, nausea, or vomiting. Patient denies dizziness, chest pain, or palpitations.    DATE OF SERVICE:  04/18/2024   PREOPERATIVE DIAGNOSES:   1.  Right pathologic anterior column acetabular fracture.   POSTOPERATIVE DIAGNOSES:   1.  Right pathologic anterior column acetabular fracture.   PROCEDURE:  Open reduction and internal fixation anterior column acetabular fracture.  SURGICAL PLAN: DISPOSITION: Weight bear as tolerated. Range of motion as able. Follow up PA versus nurse in 3 weeks, [place] in 6 weeks, x-rays at 6 week visit.   History:   Problem List[1]  Medical History[2]  Surgical History[3]  Family History[4]  No known history of anesthesia complications or bleeding/clotting disorders.   Social History   Socioeconomic History   Marital status: Single    Spouse name: Not on file   Number of children: Not on file   Years of education: Not on file   Highest education level: Not on file  Occupational History   Not on file  Tobacco Use   Smoking status: Never   Smokeless tobacco: Never  Substance and Sexual Activity   Alcohol use: Not on file   Drug use: Not on file   Sexual activity: Not on file  Other Topics Concern   Not on file  Social History Narrative    Not on file   Social Drivers of Health   Living Situation: Low Risk (04/11/2024)   Received from [facility]   Living Situation    In the last 12 months, was there a time when you were not able to pay the mortgage or rent on time?: No    In the past 12 months, how many times have you moved where you were living?: 0    At any time in the past 12 months, were you homeless or living in a shelter (including now)?: No  Food Insecurity: No Food Insecurity (04/11/2024)   Received from [facility]   Food vital sign    Within the past 12 months, you worried that your food would run out before you got money to buy more: Never true    Within the past 12 months, the food you bought just didn't last and you didn't have money to get more: Never true  Transportation Needs: No Transportation Needs (04/11/2024)   Received from [facility] - Transportation    In the past 12 months, has lack of transportation kept you from medical appointments or from getting medications?: No    In the past 12 months, has lack of transportation kept you from meetings, work, or from getting things needed for daily living?: No  Utilities: Not At Risk (04/11/2024)   Received from [facility]   Utilities    In the past 12 months has the electric, gas, oil, or water company threatened to shut  off services in your home?: No  Safety: Not At Risk (04/11/2024)   Received from [facility]   Safety    Within the last year, have you been afraid of your partner or ex-partner?: No    Within the last year, have you been humiliated or emotionally abused in other ways by your partner or ex-partner?: No    Within the last year, have you been kicked, hit, slapped, or otherwise physically hurt by your partner or ex-partner?: No    Within the last year, have you been raped or forced to have any kind of sexual activity by your partner or ex-partner?: No  Alcohol Screening: Not on file  Tobacco Use: Low Risk (04/18/2024)    Patient History    Smoking Tobacco Use: Never    Smokeless Tobacco Use: Never    Passive Exposure: Not on file  Depression: Not At Risk (04/16/2024)   PHQ-2    PHQ-2 Score: 0  Social Connections: Not on file  Financial Resource Strain: Not on file    Current Medications:  Aaron Willis has a current medication list which includes the following prescription(s): aspirin , atorvastatin , calcium  carbonate, empagliflozin, ergocalciferol, losartan , omeprazole, ondansetron , and sennosides-docusate sodium. Allergies:  has no known allergies.  Review of Systems:  Constitutional: Negative.   HENT: Negative.   Eyes: Negative.   Respiratory: Negative.   Cardiovascular: Negative.   Gastrointestinal: Negative.   Genitourinary: Negative.   Musculoskeletal:       As noted in HPI Skin: Negative.   Neurological: Negative.    OBJECTIVE   Physical Examination:   Vitals BP 150/74 (BP Location: Left arm, Patient Position: Sitting)   Pulse 82   Temp 98.3 F (36.8 C) (Temporal)   CONSTITUTIONAL: Alert, Appears well developed and well nourished, NAD  HEAD: Normocephalic, atraumatic  EYES: EOMI, PERRL  NECK: Supple  CHEST/RESPIRATORY: Symmetric expansion of the chest, no intercostal retraction, Effort normal.   CARDIOVASCULAR: Warm perfused digits, temperature warm, refill <2secs  NEUROLOGIC EXAM: Alert oriented x 3 with normal coordination  PSYCHIATRIC: Appropriate mood and affect for his age  SKIN: No erythema, no discoloration, no open draining wounds, Warm and dry. No rashes noted    ORTHOPEDIC EXAM:  PELVIS EXAM INSPECTION: Absent swelling or ecchymosis. Healing incisions noted. Staples removed.   ALIGNMENT: No deformity of the hips PALPATION: stable to lateral compression. Tender to palpation of the incisions. There is no pain about the SI joints. ROM: Decreased hip ROM due to pain.   BILATERAL LOWER EXTREMITY EXAM: VASCULAR: Extremity warm and well-perfused. 2+ dorsalis pedis and  posterior tibialis pulses. Capillary refill <2 seconds NEURO-SENSORY: Normal sensibility to light touch in tibial/DP/SP/sural/saphenous distributions. No numbness or paresthesias. No focal sensory deficit. NEURO-MOTOR: Intact EHL/FHL/TA/GS motor function. No focal motor deficit.   ASSESSMENT   1. Pathologic fracture of right acetabulum with routine healing        Radiologic Studies:   No new imaging ordered.  No orders of the defined types were placed in this encounter.   Orders:  No orders of the defined types were placed in this encounter.   Follow up: Return if symptoms worsen or fail to improve.   PLAN   1. Pathologic fracture of right acetabulum with routine healing          PLAN:   - WBAT  - Rehabilitation strategies: Activity modification, avoid provacative movements, ice, elevate, OTC pain meds per OTC instructions, gentle ROM and stretching exercises.  - PT/OT: ROMAT - Work Recommendations :  na - Medications: No new medications added or changed. - Bone health (calcium  and Vitamin D supplementation, possible need for further imaging and/or medications) discussed with patient and plan for follow up with PCP or osteoporosis clinic discussed. Patient verbalizes understanding - Education:  I reviewed the pertinent anatomy as well as potential etiology and treatment of symptoms.  Patient expressed understanding and all questions were answered during today's visit. - Follow up:  XOA as scheduled or as needed if symptoms worsen or fail to improve.    Future Appointments  Date Time Provider Department Center  06/15/2024  9:00 AM ORTHO TRAUMA [facility] ORT TRA [facility] [place]     All questions are answered, and the patient verbalizes understanding.   Risks, benefits, and alternatives of the medication(s) and treatment plan(s) were discussed, and he expressed understanding. Plan follow up as discussed or as needed.  This document was created using the aid of voice  recognition Scientist, clinical (histocompatibility and immunogenetics).        [1] Patient Active Problem List Diagnosis   Elevated PSA   BPH associated with nocturia   Pathologic fracture of right acetabulum, initial encounter  [2] No past medical history on file. [3] Past Surgical History: Procedure Laterality Date   ORIF HIP FRACTURE Right 04/18/2024   OPEN REDUCTION INTERNAL FIXATION ACETABULUM HIP performed by Selinda Charter, MD at [facility] OR  [4] No family history on file.

## 2024-05-10 ENCOUNTER — Other Ambulatory Visit (HOSPITAL_COMMUNITY): Payer: Self-pay

## 2024-05-10 ENCOUNTER — Other Ambulatory Visit: Payer: Self-pay

## 2024-05-10 ENCOUNTER — Encounter: Payer: Self-pay | Admitting: Hematology

## 2024-05-10 ENCOUNTER — Ambulatory Visit
Admission: RE | Admit: 2024-05-10 | Discharge: 2024-05-10 | Disposition: A | Source: Ambulatory Visit | Attending: Radiation Oncology

## 2024-05-10 ENCOUNTER — Telehealth: Payer: Self-pay

## 2024-05-10 DIAGNOSIS — Z510 Encounter for antineoplastic radiation therapy: Secondary | ICD-10-CM | POA: Diagnosis not present

## 2024-05-10 LAB — RAD ONC ARIA SESSION SUMMARY
Course Elapsed Days: 10
Plan Fractions Treated to Date: 9
Plan Prescribed Dose Per Fraction: 2 Gy
Plan Total Fractions Prescribed: 10
Plan Total Prescribed Dose: 20 Gy
Reference Point Dosage Given to Date: 18 Gy
Reference Point Session Dosage Given: 2 Gy
Session Number: 9

## 2024-05-10 NOTE — Telephone Encounter (Signed)
 Oral Oncology Patient Advocate Encounter   Received notification that prior authorization for Lenalidomide is required through [id number].   PA submitted on 05/10/24 Key [id number] Status is pending      Charlott Hamilton,  CPhT-Adv  she/her/hers [facility]  [facility] Pharmacy Technician Patient Advocate Specialist III [facility] Phone: [phone]  Fax: [phone] [email or web address]

## 2024-05-10 NOTE — Progress Notes (Addendum)
 Pharmacist Chemotherapy Monitoring - Initial Assessment    Anticipated start date: 05/14/24   The following has been reviewed per standard work regarding the patient's treatment regimen: The patient's diagnosis, treatment plan and drug doses, and organ/hematologic function Lab orders and baseline tests specific to treatment regimen  The treatment plan start date, drug sequencing, and pre-medications Prior authorization status  Patient's documented medication list, including drug-drug interaction screen and prescriptions for anti-emetics and supportive care specific to the treatment regimen The drug concentrations, fluid compatibility, administration routes, and timing of the medications to be used The patient's access for treatment and lifetime cumulative dose history, if applicable  The patient's medication allergies and previous infusion related reactions, if applicable   Changes made to treatment plan:  N/A  Follow up needed:  1) Rx for Revlimid vs deferred start for Revlimid - oral chemo staff discussing with MD.   Bridgett Leotis Helling, RPH, BCPS, BCOP 05/10/2024   12:56 PM

## 2024-05-11 ENCOUNTER — Other Ambulatory Visit: Payer: Self-pay

## 2024-05-11 ENCOUNTER — Ambulatory Visit
Admission: RE | Admit: 2024-05-11 | Discharge: 2024-05-11 | Disposition: A | Discharge status: Home / Self Care | Source: Ambulatory Visit | Attending: Radiation Oncology | Admitting: Radiation Oncology

## 2024-05-11 ENCOUNTER — Telehealth: Payer: Self-pay | Admitting: Pharmacist

## 2024-05-11 DIAGNOSIS — Z510 Encounter for antineoplastic radiation therapy: Secondary | ICD-10-CM | POA: Diagnosis not present

## 2024-05-11 LAB — RAD ONC ARIA SESSION SUMMARY
Course Elapsed Days: 11
Plan Fractions Treated to Date: 10
Plan Prescribed Dose Per Fraction: 2 Gy
Plan Total Fractions Prescribed: 10
Plan Total Prescribed Dose: 20 Gy
Reference Point Dosage Given to Date: 20 Gy
Reference Point Session Dosage Given: 2 Gy
Session Number: 10

## 2024-05-11 NOTE — Telephone Encounter (Addendum)
 Oral Oncology Pharmacist Encounter  Received new prescription for Revlimid  (lenalidomide ) for the induction treatment of multiple myeloma in conjunction with daratumumab , bortezomib  and dexamethasone , planned duration 6 cycle or until patient is able for autoSCT.  OK per Dr. Lanny for patient to start Revlimid  once he receives it from the specialty pharmacy - patient will still proceed with receiving dara/bortezomib /dex on 05/14/24.  CBC w/ Diff and CMP from 05/01/24 assessed, no relevant lab abnormalities requiring baseline dose adjustment required at this time. Plan is for patient to be on Revlimid  25 mg daily for 14 days on, 7 days off. Repeated every 21 days.   Current medication list in Epic reviewed, .no relevant/significant DDIs with Revlimid  identified.  Evaluated chart and no patient barriers to medication adherence noted.   Patient agreement for treatment documented in MD note on 04/16/24.  Once [employer] authorization is obtained for patient - prescription will be e-scribed to [facility] for dispensing.   Oral Oncology Clinic will continue to follow for insurance authorization, copayment issues, initial counseling and start date.  Aaron Willis, PharmD, BCPS, BCOP Hematology/Oncology Clinical Pharmacist [phone] 05/11/2024 7:04 AM

## 2024-05-11 NOTE — Telephone Encounter (Signed)
 Oral Oncology Patient Advocate Encounter  Prior Authorization for Lenalidomide  has been approved.    PA# [id number] Effective dates: 05/11/2024 through 05/11/2025  Prescription sent to [facility]    Charlott Hamilton,  CPhT-Adv  she/her/hers [facility]  [facility] Pharmacy Technician Patient Advocate Specialist III [facility] Phone: [phone]  Fax: [phone] [email or web address]

## 2024-05-13 ENCOUNTER — Other Ambulatory Visit: Payer: Self-pay | Admitting: Hematology

## 2024-05-13 NOTE — Assessment & Plan Note (Signed)
 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 Pt presented with severe right hip pain and CT and MRI showed a lytic bone lesion in right acetabular.  CT-guided bone biopsy showed plasma neoplasm. -Multiple myeloma panel showed IgG kappa light chain M protein 1.2, CBC and CMP were unremarkable. - He underwent a bone marrow biopsy subsequently, which showed 70% plasma cells, cytogenetics was normal, FISH panel showed 13 q. deletion, gain of 1 q. and 11/11 q, this is a standard risk. - He is a candidate for bone marrow transplant, I recommend first-line chemotherapy daratumumab , Revlimid , Velcade  and dexamethasone . -He underwent open reduction and internal fixation anterior column acetabular fracture at [facility] on 04/18/2024 and received palliative RT after surgery.   -plan to start daratumumab , Velcade  and dexamethasone  on 05/13/2024 and Revlimid  when he receives it later this week.

## 2024-05-14 ENCOUNTER — Inpatient Hospital Stay (HOSPITAL_BASED_OUTPATIENT_CLINIC_OR_DEPARTMENT_OTHER): Admitting: Hematology

## 2024-05-14 ENCOUNTER — Inpatient Hospital Stay

## 2024-05-14 ENCOUNTER — Telehealth: Payer: Self-pay

## 2024-05-14 VITALS — BP 144/64 | HR 86 | Temp 98.0°F | Resp 18

## 2024-05-14 VITALS — BP 140/73 | HR 76 | Temp 97.9°F | Resp 17 | Wt 209.7 lb

## 2024-05-14 DIAGNOSIS — Z5112 Encounter for antineoplastic immunotherapy: Secondary | ICD-10-CM | POA: Diagnosis not present

## 2024-05-14 DIAGNOSIS — C9000 Multiple myeloma not having achieved remission: Secondary | ICD-10-CM

## 2024-05-14 LAB — CBC WITH DIFFERENTIAL (CANCER CENTER ONLY)
Abs Immature Granulocytes: 0.02 K/uL (ref 0.00–0.07)
Basophils Absolute: 0 K/uL (ref 0.0–0.1)
Basophils Relative: 0 %
Eosinophils Absolute: 0.2 K/uL (ref 0.0–0.5)
Eosinophils Relative: 3 %
HCT: 37.2 % — ABNORMAL LOW (ref 39.0–52.0)
Hemoglobin: 12.7 g/dL — ABNORMAL LOW (ref 13.0–17.0)
Immature Granulocytes: 0 %
Lymphocytes Relative: 34 %
Lymphs Abs: 1.8 K/uL (ref 0.7–4.0)
MCH: 32.6 pg (ref 26.0–34.0)
MCHC: 34.1 g/dL (ref 30.0–36.0)
MCV: 95.4 fL (ref 80.0–100.0)
Monocytes Absolute: 0.7 K/uL (ref 0.1–1.0)
Monocytes Relative: 13 %
Neutro Abs: 2.7 K/uL (ref 1.7–7.7)
Neutrophils Relative %: 50 %
Platelet Count: 191 K/uL (ref 150–400)
RBC: 3.9 MIL/uL — ABNORMAL LOW (ref 4.22–5.81)
RDW: 15.9 % — ABNORMAL HIGH (ref 11.5–15.5)
WBC Count: 5.3 K/uL (ref 4.0–10.5)
nRBC: 0 % (ref 0.0–0.2)

## 2024-05-14 LAB — CMP (CANCER CENTER ONLY)
ALT: 20 U/L (ref 0–44)
AST: 18 U/L (ref 15–41)
Albumin: 4 g/dL (ref 3.5–5.0)
Alkaline Phosphatase: 70 U/L (ref 38–126)
Anion gap: 8 (ref 5–15)
BUN: 16 mg/dL (ref 8–23)
CO2: 29 mmol/L (ref 22–32)
Calcium: 9.6 mg/dL (ref 8.9–10.3)
Chloride: 102 mmol/L (ref 98–111)
Creatinine: 1.02 mg/dL (ref 0.61–1.24)
GFR, Estimated: >60 mL/min
Glucose, Bld: 109 mg/dL — ABNORMAL HIGH (ref 70–99)
Potassium: 4.2 mmol/L (ref 3.5–5.1)
Sodium: 139 mmol/L (ref 135–145)
Total Bilirubin: 0.4 mg/dL (ref 0.0–1.2)
Total Protein: 7.7 g/dL (ref 6.5–8.1)

## 2024-05-14 MED ORDER — DEXAMETHASONE 6 MG PO TABS
20.0000 mg | ORAL_TABLET | Freq: Once | ORAL | Status: AC
  Administered 2024-05-14: 20 mg via ORAL
  Filled 2024-05-14: qty 2

## 2024-05-14 MED ORDER — PANTOPRAZOLE SODIUM 40 MG PO TBEC: 40.0000 mg | DELAYED_RELEASE_TABLET | Freq: Every day | ORAL | 3 refills | Status: AC

## 2024-05-14 MED ORDER — SODIUM CHLORIDE 0.9 % IV SOLN: INTRAVENOUS | Status: DC

## 2024-05-14 MED ORDER — BORTEZOMIB CHEMO SQ INJECTION 3.5 MG (2.5MG/ML)
1.3000 mg/m2 | Freq: Once | INTRAMUSCULAR | Status: AC
  Administered 2024-05-14: 2.75 mg via SUBCUTANEOUS
  Filled 2024-05-14: qty 1.1

## 2024-05-14 MED ORDER — ACETAMINOPHEN 325 MG PO TABS
650.0000 mg | ORAL_TABLET | Freq: Once | ORAL | Status: AC
  Administered 2024-05-14: 650 mg via ORAL
  Filled 2024-05-14: qty 2

## 2024-05-14 MED ORDER — DARATUMUMAB-HYALURONIDASE-FIHJ 1800-30000 MG-UT/15ML ~~LOC~~ SOLN
1800.0000 mg | Freq: Once | SUBCUTANEOUS | Status: AC
  Administered 2024-05-14: 1800 mg via SUBCUTANEOUS
  Filled 2024-05-14: qty 15

## 2024-05-14 MED ORDER — MONTELUKAST SODIUM 10 MG PO TABS
10.0000 mg | ORAL_TABLET | Freq: Once | ORAL | Status: AC
  Administered 2024-05-14: 10 mg via ORAL
  Filled 2024-05-14: qty 1

## 2024-05-14 MED ORDER — DIPHENHYDRAMINE HCL 25 MG PO CAPS
25.0000 mg | ORAL_CAPSULE | Freq: Once | ORAL | Status: AC
  Administered 2024-05-14: 25 mg via ORAL
  Filled 2024-05-14: qty 1

## 2024-05-14 MED ORDER — ZOLEDRONIC ACID 4 MG/100ML IV SOLN
4.0000 mg | Freq: Once | INTRAVENOUS | Status: AC
  Administered 2024-05-14: 4 mg via INTRAVENOUS
  Filled 2024-05-14: qty 100

## 2024-05-14 NOTE — Telephone Encounter (Signed)
 Received fax for dental clearance, stamped to be scanned and placed in folder.

## 2024-05-14 NOTE — Radiation Completion Notes (Signed)
 Patient Name: Aaron Willis, HINZE MRN: [id number] Date of Birth: Oct 15, 1960 Referring Physician: ONITA MATTOCK, M.D. Date of Service: 2024-05-14 Radiation Oncologist: Estefana Cha, M.D. [facility]                             RADIATION ONCOLOGY END OF TREATMENT NOTE     Diagnosis: C90.00 Multiple myeloma not having achieved remission Intent: Palliative     ==========DELIVERED PLANS==========  First Treatment Date: 2024-04-30 Last Treatment Date: 2024-05-11   Plan Name: Pelvis_R Site: Hip, Right Technique: Isodose Plan Mode: Photon Dose Per Fraction: 2 Gy Prescribed Dose (Delivered / Prescribed): 20 Gy / 20 Gy Prescribed Fxs (Delivered / Prescribed): 10 / 10     ==========ON TREATMENT VISIT DATES========== 2024-05-02, 2024-05-09     ==========UPCOMING VISITS========== 07/09/2024 [facility]  07/09/2024 [facility] EST PT 20 Mattock Onita, MD  07/09/2024 [facility] INFUSION 2HR15MIN (135) [facility]  07/05/2024 [facility] INFUSION 1HR15MIN (75) [facility]  07/02/2024 [facility]  07/02/2024 [facility] EST PT 20 Mattock Onita, MD  07/02/2024 [facility] INFUSION 2HR (120) [facility]  06/28/2024 [facility] INFUSION 1HR15MIN (75) [facility] INF[facility]  06/25/2024 [facility]  06/25/2024 [facility] EST PT 20 Mattock Onita, MD  06/25/2024 [facility] INFUSION 2HR (120) [facility]  06/18/2024 [facility]  06/18/2024 [facility] EST PT 20 Mattock Onita, MD  06/18/2024 [facility] INFUSION 2HR15MIN (135) [facility]  06/14/2024 [facility] FOLLOW UP 20 Cha Estefana, MD  06/14/2024 [facility] INFUSION 1HR15MIN (75) [facility] INFUS[facility]  06/11/2024 [facility]  [facility]  06/11/2024 [facility] EST PT 20 Mattock Onita, MD  06/11/2024 [facility] INFUSION 2HR (120) [facility] INF[facility]  06/07/2024 [facility] INFUSION 1HR15MIN (75) [facility] INF[facility]  06/04/2024 [facility]  06/04/2024 [facility] EST PT 20 Mattock Onita, MD  06/04/2024 [facility] INFUSION 2HR (120) [facility]  05/28/2024 [facility] EST PT 30 Boscia, Heather E, NP  05/28/2024 [facility]  05/28/2024 [facility] INFUSION 2HR15MIN (135) [facility] INF[facility]  05/24/2024 [facility] INFUSION 1HR15MIN (75) [facility] INF[facility]  05/21/2024 [facility]  05/21/2024 [facility] EST PT 20 Mattock Onita, MD  05/21/2024 [facility] INFUSION 3HR (180) [facility] INFUS[facility]  05/17/2024 [facility] INFUSION 1HR15MIN (75) [facility] INF[facility]  05/14/2024 [facility]  05/14/2024 [facility] EST PT 20 Mattock Onita, MD  05/14/2024 [facility] INFUSION 4HR (240) [facility] INF[facility]        ==========APPENDIX - ON TREATMENT VISIT NOTES==========   See weekly On Treatment Notes in Epic for details in the Media tab (listed as Progress notes on the On Treatment Visit Dates listed above).

## 2024-05-14 NOTE — Progress Notes (Unsigned)
 " [facility]   Telephone:(336) [phone] Fax:(336) [phone]   Clinic Follow up Note   [facility] - General (General Practice)  Date of Service:  05/14/2024  CHIEF COMPLAINT: f/u of MM  CURRENT THERAPY:  Starting [facility]  Oncology History   Multiple myeloma (HCC) RISS stage I, standard risk  -diagnosed in 02/2024. Pt presented with severe right hip pain and CT and MRI showed a lytic bone lesion in right acetabular.  CT-guided bone biopsy showed plasma neoplasm. -Multiple myeloma panel showed IgG kappa light chain M protein 1.2, CBC and CMP were unremarkable. - He underwent a bone marrow biopsy subsequently, which showed 70% plasma cells, cytogenetics was normal, FISH panel showed 13 q. deletion, gain of 1 q. and 11/11 q, this is a standard risk. - He is a candidate for bone marrow transplant, I recommend first-line chemotherapy daratumumab , Revlimid , Velcade  and dexamethasone . -He underwent open reduction and internal fixation anterior column acetabular fracture at [facility] on 04/18/2024 and received palliative RT after surgery.   -plan to start daratumumab , Velcade  and dexamethasone  on 05/13/2024 and Revlimid  when he receives it later this week.  Assessment & Plan Multiple myeloma not having achieved remission Newly diagnosed multiple myeloma, not in remission. Disease burden remains stable with slight increase in M protein and mildly elevated light chains. He is initiating induction chemotherapy. Prior pathological fracture managed with surgery and radiation. Autologous stem cell transplant is under consideration post-induction. Induction regimen is expected to be tolerated with mild fatigue, low risk of gastrointestinal toxicity, but significant immunosuppression and infection risk. Maintenance therapy will be considered following induction and transplant evaluation. - Initiated induction chemotherapy with daratumumab  (weekly, then every other week,  then monthly for maintenance), bortezomib  (every other week for cycle 1, then weekly from cycle 2), and dexamethasone  (20 mg weekly). - Prescribed oral lenalidomide  to be started upon receipt (2 weeks on, 1 week off per cycle). - Administered premedications (diphenhydramine , acetaminophen , montelukast ) prior to infusion to prevent hypersensitivity reactions. - Ordered referral to [facility] transplant program for autologous stem cell transplant evaluation after induction. - Scheduled weekly follow-up visits during initial therapy, with plan to decrease frequency if tolerated. - Instructed him to monitor for signs of infection and fever, and to maintain up-to-date immunizations (COVID-19, influenza, RSV, herpes zoster). - Discussed timing of herpes zoster vaccination (to be administered between cycles, preferably after first month when infusion frequency decreases).  Type 2 diabetes mellitus Type 2 diabetes mellitus managed by the [facility]. High-dose dexamethasone  is expected to cause hyperglycemia, particularly post-administration. He is on empagliflozin, which can be continued. He has not recently monitored glucose but is aware of target values and the need for close monitoring during chemotherapy. - Advised close blood glucose monitoring, especially after chemotherapy and steroid administration (at least daily, including fasting and postprandial checks). - Recommended maintaining a log of glucose readings (date, time, pre/postprandial) to share with diabetes care team. - Confirmed no major drug interaction between dexamethasone  and empagliflozin; instructed him to resume empagliflozin. - [facility]ed [facility] weekly high-dose steroid use for medication adjustment as needed. - Discussed continuous glucose monitoring; he declined at this time.  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 Blood counts are stable with only very mild anemia. No  acute symptoms attributable to anemia.  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 Orthopedic surgery and radiation completed with good wound healing and  improved pain. Bone-strengthening therapy (zoledronic  acid) planned pending dental clearance. He is on calcium  and vitamin D supplementation. Risk of osteonecrosis of the jaw with zoledronic  acid discussed, emphasizing dental hygiene and prompt management of dental issues. - Confirmed dental clearance for zoledronic  acid; will fax dental clearance form to dentist if not already received. - Instructed to maintain regular dental care and schedule dental cleaning prior to zoledronic  acid initiation if possible. - Discussed risk of osteonecrosis of the jaw with zoledronic  acid and importance of dental hygiene and prompt dental management. - Advised to increase calcium  and vitamin D to twice daily when zoledronic  acid is started; continue once daily until then. - Continue follow-up with orthopedic surgery as scheduled.  Plan - Lab reviewed, adequate for treatment, will proceed for cycle daratumumab , Velcade  and dexamethasone  today, he will receive Velcade  on day 1, 4, 8, and 11 for the first cycle -Revlimid  will be called in today, he will start as soon as he receives the medication  -f/u in a week    SUMMARY OF ONCOLOGIC HISTORY: Oncology History  Multiple myeloma (HCC)  03/26/2024 Cancer Staging   Staging form: Plasma Cell Myeloma and Plasma Cell Disorders, AJCC 8th Edition - Clinical stage from 03/26/2024: RISS Stage I (Beta-2-microglobulin (mg/L): 2.2, Albumin (g/dL): 3.6, ISS: Stage I, High-risk cytogenetics: Absent, LDH: Normal) - Signed by Lanny Callander, MD on 04/09/2024 Stage prefix: Initial diagnosis Beta 2 microglobulin range (mg/L): Less than 3.5 Albumin range (g/dL): Greater than or equal to 3.5 Cytogenetics: 1q addition, Other mutation   04/09/2024 Initial Diagnosis   Multiple  myeloma (HCC)   05/14/2024 -  Chemotherapy   Patient is on Treatment Plan : MYELOMA NEWLY DIAGNOSED TRANSPLANT CANDIDATE DaraVRd (Daratumumab  SQ) with biweekly bortezomib  (D1,4,8,11) q21d x 6 Cycles (Induction/Consolidation)        Discussed the use of AI scribe software for clinical note transcription with the patient, who gave verbal consent to proceed.  History of Present Illness Aaron Willis is a 64 year old male with newly diagnosed multiple myeloma presenting to initiate induction chemotherapy.  He recently had orthopedic surgery for a pathologic fracture with three small incisions that have healed well. Stitches were removed last week. He follows with orthopedics and reports no wound complications. He also completed radiation to the fracture site.  He has mild residual hip pain that has improved but increases with prolonged walking. He occasionally uses a cane. Knee arthritis also contributes to discomfort. He denies new pain, nausea, vomiting, or diarrhea. He has mild fatigue that does not limit daily activities.  He lost about five pounds over the past month around the time of surgery but now has good appetite and oral intake. He takes daily calcium  and vitamin D. Dental clearance for zoledronic  acid is pending. He recently had a crown replacement and has a partial denture. Dental cleaning is scheduled in 2 to 3 months.  He has type 2 diabetes on Jardiance, which he has not taken for 2 days. He has not checked finger-stick glucoses in the last few days. Last reported glucose was 109 mg/dL after breakfast. He is considering continuous glucose monitoring.  He is up to date on COVID, influenza, and RSV vaccines and has not received shingles vaccine. He has elevated PSA with prior negative prostate biopsy. PSA values were previously up to 15. Prostate MRI is scheduled and he follows with urology.     All other systems were reviewed with the patient and are negative.  MEDICAL  HISTORY:  Past Medical  History:  Diagnosis Date   Diabetes mellitus without complication (HCC)    Hypertension     SURGICAL HISTORY: Past Surgical History:  Procedure Laterality Date   FEMUR FRACTURE SURGERY Right    IR BONE MARROW BIOPSY & ASPIRATION  03/28/2024   KNEE SURGERY Left     I have reviewed the social history and family history with the patient and they are unchanged from previous note.  ALLERGIES:  has no active allergies.  MEDICATIONS:  Current Outpatient Medications  Medication Sig Dispense Refill   acetaminophen  (TYLENOL ) 500 MG tablet Take 500 mg by mouth every 6 (six) hours as needed.     acyclovir  (ZOVIRAX ) 400 MG tablet Take 1 tablet (400 mg total) by mouth 2 (two) times daily. 60 tablet 5   aspirin  EC 81 MG tablet Take 1 tablet (81 mg total) by mouth daily. 100 tablet 3   atorvastatin  (LIPITOR) 40 MG tablet Take 40 mg by mouth daily.     Empagliflozin-metFORMIN HCl ER 25-1000 MG TB24 Take 1 tablet by mouth daily.     fluticasone  (FLONASE ) 50 MCG/ACT nasal spray Place 2 sprays into both nostrils daily. (Patient taking differently: Place 2 sprays into both nostrils daily as needed for allergies.) 16 g 6   ibuprofen  (ADVIL ) 600 MG tablet Take 1 tablet (600 mg total) by mouth every 8 (eight) hours as needed.     losartan  (COZAAR ) 100 MG tablet Take 50 mg by mouth daily.     ondansetron  (ZOFRAN ) 8 MG tablet Take 1 tablet (8 mg total) by mouth every 8 (eight) hours as needed for nausea or vomiting. 30 tablet 1   prochlorperazine  (COMPAZINE ) 10 MG tablet Take 1 tablet (10 mg total) by mouth every 6 (six) hours as needed for nausea or vomiting. 30 tablet 1   traMADol  (ULTRAM ) 50 MG tablet Take 1 tablet (50 mg total) by mouth every 6 (six) hours as needed. 30 tablet 0   pantoprazole  (PROTONIX ) 40 MG tablet Take 1 tablet (40 mg total) by mouth daily. 30 tablet 3   No current facility-administered medications for this visit.    PHYSICAL EXAMINATION: ECOG PERFORMANCE  STATUS: 1 - Symptomatic but completely ambulatory  Vitals:   05/14/24 1109 05/14/24 1110  BP: (!) 145/66 (!) 140/73  Pulse: 76   Resp: 17   Temp: 97.9 F (36.6 C)   SpO2: 100%    Wt Readings from Last 3 Encounters:  05/14/24 209 lb 11.2 oz (95.1 kg)  04/11/24 212 lb 4 oz (96.3 kg)  04/11/24 212 lb 6.4 oz (96.3 kg)     GENERAL:alert, no distress and comfortable SKIN: skin color, texture, turgor are normal, no rashes or significant lesions EYES: normal, Conjunctiva are pink and non-injected, sclera clear NECK: supple, thyroid normal size, non-tender, without nodularity LYMPH:  no palpable lymphadenopathy in the cervical, axillary  LUNGS: clear to auscultation and percussion with normal breathing effort HEART: regular rate & rhythm and no murmurs and no lower extremity edema ABDOMEN:abdomen soft, non-tender and normal bowel sounds Musculoskeletal:no cyanosis of digits and no clubbing  NEURO: alert & oriented x 3 with fluent speech, no focal motor/sensory deficits  Physical Exam    LABORATORY DATA:  I have reviewed the data as listed    Latest Ref Rng & Units 05/14/2024   10:49 AM 05/01/2024   12:47 PM 03/28/2024   10:00 AM  CBC  WBC 4.0 - 10.5 K/uL 5.3  8.5  8.9   Hemoglobin 13.0 - 17.0 g/dL 12.7  12.5  14.0   Hematocrit 39.0 - 52.0 % 37.2  36.3  42.6   Platelets 150 - 400 K/uL 191  425  332         Latest Ref Rng & Units 05/14/2024   10:49 AM 05/01/2024   12:47 PM 03/27/2024    4:55 AM  CMP  Glucose 70 - 99 mg/dL 890  97  847   BUN 8 - 23 mg/dL 16  12  15    Creatinine 0.61 - 1.24 mg/dL 8.97  8.97  8.95   Sodium 135 - 145 mmol/L 139  137  135   Potassium 3.5 - 5.1 mmol/L 4.2  4.1  4.5   Chloride 98 - 111 mmol/L 102  103  99   CO2 22 - 32 mmol/L 29  28  27    Calcium  8.9 - 10.3 mg/dL 9.6  9.5  9.6   Total Protein 6.5 - 8.1 g/dL 7.7  7.8    Total Bilirubin 0.0 - 1.2 mg/dL 0.4  0.3    Alkaline Phos 38 - 126 U/L 70  77    AST 15 - 41 U/L 18  24    ALT 0 - 44 U/L 20   41        RADIOGRAPHIC STUDIES: I have personally reviewed the radiological images as listed and agreed with the findings in the report. No results found.    No orders of the defined types were placed in this encounter.  All questions were answered. The patient knows to call the clinic with any problems, questions or concerns. No barriers to learning was detected. The total time spent in the appointment was 40 minutes, including review of chart and various tests results, discussions about plan of care and coordination of care plan     Onita Mattock, MD 05/14/2024     "

## 2024-05-14 NOTE — Telephone Encounter (Signed)
 Faxed Dental clearance over [facility] fax# [phone] and  P#[phone].

## 2024-05-14 NOTE — Progress Notes (Signed)
 Pt observed for 2 hours post 1st dara injection. Pt tolerated Tx well w/out incident. No reactions noted. VSS at discharge. AVS given. Ambulatory to lobby.

## 2024-05-14 NOTE — Patient Instructions (Signed)
 [facility] - A DEPT OF [place]  Discharge Instructions: Thank you for choosing [facility] to provide your oncology and hematology care.   If you have a lab appointment with the Cancer Center, please go directly to the Cancer Center and check in at the registration area.   Wear comfortable clothing and clothing appropriate for easy access to any Portacath or PICC line.   We strive to give you quality time with your provider. You may need to reschedule your appointment if you arrive late (15 or more minutes).  Arriving late affects you and other patients whose appointments are after yours.  Also, if you miss three or more appointments without notifying the office, you may be dismissed from the clinic at the provider's discretion.      For prescription refill requests, have your pharmacy contact our office and allow 72 hours for refills to be completed.    Today you received the following chemotherapy and/or immunotherapy agents: bortezomib  SQ (VELCADE )  daratumumab -hyaluronidase -fihj (DARZALEX  FASPRO)    To help prevent nausea and vomiting after your treatment, we encourage you to take your nausea medication as directed.  BELOW ARE SYMPTOMS THAT SHOULD BE REPORTED IMMEDIATELY: *FEVER GREATER THAN 100.4 F (38 C) OR HIGHER *CHILLS OR SWEATING *NAUSEA AND VOMITING THAT IS NOT CONTROLLED WITH YOUR NAUSEA MEDICATION *UNUSUAL SHORTNESS OF BREATH *UNUSUAL BRUISING OR BLEEDING *URINARY PROBLEMS (pain or burning when urinating, or frequent urination) *BOWEL PROBLEMS (unusual diarrhea, constipation, pain near the anus) TENDERNESS IN MOUTH AND THROAT WITH OR WITHOUT PRESENCE OF ULCERS (sore throat, sores in mouth, or a toothache) UNUSUAL RASH, SWELLING OR PAIN  UNUSUAL VAGINAL DISCHARGE OR ITCHING   Items with * indicate a potential emergency and should be followed up as soon as possible or go to the Emergency Department if any problems should  occur.  Please show the CHEMOTHERAPY ALERT CARD or IMMUNOTHERAPY ALERT CARD at check-in to the Emergency Department and triage nurse.  Should you have questions after your visit or need to cancel or reschedule your appointment, please contact [facility]  Dept: [phone]  and follow the prompts.  Office hours are 8:00 a.m. to 4:30 p.m. Monday - Friday. Please note that voicemails left after 4:00 p.m. may not be returned until the following business day.  We are closed weekends and major holidays. You have access to a nurse at all times for urgent questions. Please call the main number to the clinic Dept: [phone] and follow the prompts.   For any non-urgent questions, you may also contact your provider using MyChart. We now offer e-Visits for anyone 48 and older to request care online for non-urgent symptoms. For details visit [email or web address].   Also download the MyChart app! Go to the app store, search MyChart, open the app, select [place], and log in with your MyChart username and password.

## 2024-05-15 ENCOUNTER — Encounter: Payer: Self-pay | Admitting: Hematology

## 2024-05-15 ENCOUNTER — Other Ambulatory Visit: Payer: Self-pay

## 2024-05-15 DIAGNOSIS — C9000 Multiple myeloma not having achieved remission: Secondary | ICD-10-CM

## 2024-05-15 NOTE — Progress Notes (Signed)
 Lenalidomide  paperwork and prescription faxed to [facility] by [employer] (prescription w/insurance, allergies, and current medications) and [facility] (Pt signature pages for and agreement form).  Faxed confirmation received on both faxes.  Verbal order w/readback from Dr Lanny for referral to [facility] Team.  Referral packet faxed and confirmation received.

## 2024-05-17 ENCOUNTER — Other Ambulatory Visit: Payer: Self-pay

## 2024-05-17 ENCOUNTER — Inpatient Hospital Stay

## 2024-05-17 ENCOUNTER — Other Ambulatory Visit: Payer: Self-pay | Admitting: Pharmacist

## 2024-05-17 ENCOUNTER — Other Ambulatory Visit: Payer: Self-pay | Admitting: Hematology

## 2024-05-17 VITALS — BP 121/69 | HR 70 | Temp 98.9°F | Resp 16 | Ht 66.0 in | Wt 209.0 lb

## 2024-05-17 DIAGNOSIS — Z5112 Encounter for antineoplastic immunotherapy: Secondary | ICD-10-CM | POA: Diagnosis not present

## 2024-05-17 DIAGNOSIS — C9000 Multiple myeloma not having achieved remission: Secondary | ICD-10-CM

## 2024-05-17 MED ORDER — LENALIDOMIDE 25 MG PO CAPS: 25.0000 mg | ORAL_CAPSULE | Freq: Every day | ORAL | 2 refills | Status: DC

## 2024-05-17 MED ORDER — PROCHLORPERAZINE MALEATE 10 MG PO TABS
10.0000 mg | ORAL_TABLET | Freq: Once | ORAL | Status: AC
  Administered 2024-05-17: 10 mg via ORAL
  Filled 2024-05-17: qty 1

## 2024-05-17 MED ORDER — BORTEZOMIB CHEMO SQ INJECTION 3.5 MG (2.5MG/ML)
1.3000 mg/m2 | Freq: Once | INTRAMUSCULAR | Status: AC
  Administered 2024-05-17: 2.75 mg via SUBCUTANEOUS
  Filled 2024-05-17: qty 1.1

## 2024-05-17 MED ORDER — LENALIDOMIDE 25 MG PO CAPS: 25.0000 mg | ORAL_CAPSULE | Freq: Every day | ORAL | 0 refills | Status: AC

## 2024-05-17 NOTE — Patient Instructions (Signed)
 [facility] - A DEPT OF [place]  Discharge Instructions: Thank you for choosing [facility] to provide your oncology and hematology care.   If you have a lab appointment with the Cancer Center, please go directly to the Cancer Center and check in at the registration area.   Wear comfortable clothing and clothing appropriate for easy access to any Portacath or PICC line.   We strive to give you quality time with your provider. You may need to reschedule your appointment if you arrive late (15 or more minutes).  Arriving late affects you and other patients whose appointments are after yours.  Also, if you miss three or more appointments without notifying the office, you may be dismissed from the clinic at the provider's discretion.      For prescription refill requests, have your pharmacy contact our office and allow 72 hours for refills to be completed.    Today you received the following chemotherapy and/or immunotherapy agents:Bortezimib (velcade )     To help prevent nausea and vomiting after your treatment, we encourage you to take your nausea medication as directed.  BELOW ARE SYMPTOMS THAT SHOULD BE REPORTED IMMEDIATELY: *FEVER GREATER THAN 100.4 F (38 C) OR HIGHER *CHILLS OR SWEATING *NAUSEA AND VOMITING THAT IS NOT CONTROLLED WITH YOUR NAUSEA MEDICATION *UNUSUAL SHORTNESS OF BREATH *UNUSUAL BRUISING OR BLEEDING *URINARY PROBLEMS (pain or burning when urinating, or frequent urination) *BOWEL PROBLEMS (unusual diarrhea, constipation, pain near the anus) TENDERNESS IN MOUTH AND THROAT WITH OR WITHOUT PRESENCE OF ULCERS (sore throat, sores in mouth, or a toothache) UNUSUAL RASH, SWELLING OR PAIN  UNUSUAL VAGINAL DISCHARGE OR ITCHING   Items with * indicate a potential emergency and should be followed up as soon as possible or go to the Emergency Department if any problems should occur.  Please show the CHEMOTHERAPY ALERT CARD or  IMMUNOTHERAPY ALERT CARD at check-in to the Emergency Department and triage nurse.  Should you have questions after your visit or need to cancel or reschedule your appointment, please contact [facility]  Dept: [phone]  and follow the prompts.  Office hours are 8:00 a.m. to 4:30 p.m. Monday - Friday. Please note that voicemails left after 4:00 p.m. may not be returned until the following business day.  We are closed weekends and major holidays. You have access to a nurse at all times for urgent questions. Please call the main number to the clinic Dept: [phone] and follow the prompts.   For any non-urgent questions, you may also contact your provider using MyChart. We now offer e-Visits for anyone 29 and older to request care online for non-urgent symptoms. For details visit [email or web address].   Also download the MyChart app! Go to the app store, search MyChart, open the app, select [place], and log in with your MyChart username and password.

## 2024-05-17 NOTE — Progress Notes (Signed)
 Received a fax from the [facility] requesting completion of MT Form for Lenalidomide  Rx. Completed the form and faxed back to [facility] [phone]  [phone]).  Fax confirmation received.  [facility] of [facility].  Notified Dr Lanny and [facility] of [facility] request.

## 2024-05-18 ENCOUNTER — Other Ambulatory Visit: Payer: Self-pay

## 2024-05-18 ENCOUNTER — Telehealth: Payer: Self-pay | Admitting: Hematology

## 2024-05-18 ENCOUNTER — Other Ambulatory Visit: Payer: Self-pay | Admitting: Hematology

## 2024-05-18 DIAGNOSIS — C9000 Multiple myeloma not having achieved remission: Secondary | ICD-10-CM

## 2024-05-18 NOTE — Telephone Encounter (Signed)
 Left VM regarding new inf date

## 2024-05-21 ENCOUNTER — Inpatient Hospital Stay: Admitting: Hematology

## 2024-05-21 ENCOUNTER — Telehealth: Payer: Self-pay | Admitting: Pharmacist

## 2024-05-21 ENCOUNTER — Inpatient Hospital Stay

## 2024-05-21 ENCOUNTER — Telehealth: Payer: Self-pay

## 2024-05-21 DIAGNOSIS — C9000 Multiple myeloma not having achieved remission: Secondary | ICD-10-CM

## 2024-05-21 NOTE — Telephone Encounter (Signed)
 Oral Oncology Patient Advocate Encounter  Confirmed Lenalidomide  #14 for 21 d/s prescription received by [facility]  and is scheduled for delivery 05-21-24     Charlott Hamilton,  CPhT-Adv  she/her/hers [facility]  [facility] Pharmacy Technician Patient Advocate Specialist III [facility] Phone: [phone]  Fax: [phone] [email or web address]

## 2024-05-21 NOTE — Patient Instructions (Signed)
 [facility] [place] - A DEPT OF [place]  Thank you for choosing [facility] to provide your oncology/hematology care and for allowing us  to participate in your care today!  As a reminder, we spoke about the following today: Revlimid  (lenalidomide ) for the induction treatment of multiple myeloma in conjunction with daratumumab , bortezomib  and dexamethasone , planned duration 6 cycle or until patient is able for autoSCT   Treatment goal: Control  Medication handout has been provided.   **For oral cancer medication prescription refill requests, contact your pharmacy and they will contact our office if needed. Allow 5-7 days for refills to be completed by your specialty pharmacy.    Cancer Center General Instructions:  If you have an appointment at the [facility], please go directly to the Cancer Center and check in at the registration area.  We strive to give you quality time with your provider. You may need to reschedule your appointment if you arrive late (15 or more minutes).  Arriving late affects you and other patients whose appointments are after yours.  Also, if you miss three or more appointments without notifying the office, you may be dismissed from the clinic at the provider's discretion.      BELOW ARE SYMPTOMS THAT SHOULD BE REPORTED IMMEDIATELY: *FEVER GREATER THAN 100.4 F (38 C) OR HIGHER *CHILLS OR SWEATING *NAUSEA AND VOMITING THAT IS NOT CONTROLLED WITH YOUR NAUSEA MEDICATION *UNUSUAL SHORTNESS OF BREATH *UNUSUAL BRUISING OR BLEEDING *URINARY PROBLEMS (pain or burning when urinating, or frequent urination) *BOWEL PROBLEMS (unusual diarrhea, constipation, pain near the anus) TENDERNESS IN MOUTH AND THROAT WITH OR WITHOUT PRESENCE OF ULCERS (sore throat, sores in mouth, or a toothache) UNUSUAL RASH, SWELLING OR PAIN  UNUSUAL VAGINAL DISCHARGE OR ITCHING   Items with * indicate a potential emergency and should be followed up as  soon as possible or go to the Emergency Department if any problems should occur.  Please show the CHEMOTHERAPY ALERT CARD at check-in to the Emergency Department and triage nurse.  Should you have questions after your visit or need to cancel or reschedule your appointment, please contact [facility] [place] - A DEPT OF [facility]  [phone] and follow the prompts.  Office hours are 8:00 a.m. to 4:30 p.m. Monday - Friday. Please note that voicemails left after 4:00 p.m. may not be returned until the following business day.  We are closed weekends and major holidays. You have access to a nurse at all times for urgent questions. Please call the main number to the clinic [phone] and follow the prompts.  For any non-urgent questions, you may also contact your provider using MyChart. We now offer e-Visits for anyone 68 and older to request care online for non-urgent symptoms. For details visit [email or web address].   Also download the MyChart app! Go to the app store, search MyChart, open the app, select [place], and log in with your MyChart username and password.

## 2024-05-21 NOTE — Progress Notes (Signed)
 Oral Chemotherapy Pharmacist Encounter  I spoke with patient for overview of: Revlimid  (lenalidomide ) for the induction treatment of multiple myeloma in conjunction with daratumumab , bortezomib  and dexamethasone , planned duration 6 cycle or until patient is able for autoSCT   Treatment goal: Control  Counseled patient on administration, dosing, side effects, monitoring, drug-food interactions, safe handling, storage, and disposal.  Patient will take Revlimid  25mg  capsules, 1 capsule by mouth once daily, without regard to food, with a full glass of water.  Revlimid  will be given 14 days on, 7 days off, repeat every 21 days.  Revlimid  start date: Tentatively 05/21/24 PM (pending on shipment from [facility])  We discussed that he will have a shortened first cycle of treatment of the Revlimid  so that we can time his Revlimid  with his other treatment. Aaron Willis will start Revlimid  when he receives it and take it through 05/27/24. He will take NO Revlimid  05/28/24 through 06/03/24, and then will restart Revlimid  on 06/04/24 (cycle 2 of treatment). Starting on 06/04/24 he will take the Revlimid  for 14 days on, 7 days off, repeated every 21 days.   Adverse effects of Revlimid  include but are not limited to: nausea, GI upset, rash, fatigue, and decreased blood counts.   VTE PPX: patient will start aspirin  81 mg daily while on Revlimid  to decrease VTE risk.  VZV PPX: patient endorses taking acyclovir  400 mg PO BID   Reviewed with patient importance of keeping a medication schedule and plan for any missed doses. No barriers to medication adherence identified.  Medication reconciliation performed and medication/allergy list updated.  Distress thermometer flowsheet: Distress thermometer not completed during telephone call as this was already completed on 05/01/24.  Communication and Learning Assessment Primary learner: Patient Barriers to learning: No barriers Preferred language: English Learning  preferences: Listening Reading  All questions answered.  Aaron Willis voiced understanding and appreciation.   Medication education handout placed in mail for patient. Patient knows to call the office with questions or concerns. Oral Chemotherapy Clinic phone number provided to patient.   Aaron Willis, PharmD, BCPS, BCOP Hematology/Oncology Clinical Pharmacist 05/21/2024 2:20 PM

## 2024-05-22 ENCOUNTER — Inpatient Hospital Stay

## 2024-05-22 ENCOUNTER — Inpatient Hospital Stay: Admitting: Hematology

## 2024-05-22 NOTE — Addendum Note (Signed)
 Addended by: GEOFFRY STABS F on: 05/22/2024 11:09 AM   Modules accepted: Orders

## 2024-05-24 ENCOUNTER — Inpatient Hospital Stay

## 2024-05-24 ENCOUNTER — Inpatient Hospital Stay: Admitting: Hematology

## 2024-05-24 VITALS — BP 144/74 | HR 84 | Temp 98.5°F | Resp 16 | Ht 66.0 in | Wt 214.6 lb

## 2024-05-24 VITALS — BP 137/68 | HR 75 | Resp 18

## 2024-05-24 DIAGNOSIS — C9000 Multiple myeloma not having achieved remission: Secondary | ICD-10-CM

## 2024-05-24 DIAGNOSIS — Z5112 Encounter for antineoplastic immunotherapy: Secondary | ICD-10-CM | POA: Diagnosis not present

## 2024-05-24 LAB — CMP (CANCER CENTER ONLY)
ALT: 21 U/L (ref 0–44)
AST: 19 U/L (ref 15–41)
Albumin: 4.1 g/dL (ref 3.5–5.0)
Alkaline Phosphatase: 72 U/L (ref 38–126)
Anion gap: 10 (ref 5–15)
BUN: 14 mg/dL (ref 8–23)
CO2: 25 mmol/L (ref 22–32)
Calcium: 8.8 mg/dL — ABNORMAL LOW (ref 8.9–10.3)
Chloride: 102 mmol/L (ref 98–111)
Creatinine: 0.95 mg/dL (ref 0.61–1.24)
GFR, Estimated: >60 mL/min
Glucose, Bld: 132 mg/dL — ABNORMAL HIGH (ref 70–99)
Potassium: 3.5 mmol/L (ref 3.5–5.1)
Sodium: 138 mmol/L (ref 135–145)
Total Bilirubin: 0.4 mg/dL (ref 0.0–1.2)
Total Protein: 7 g/dL (ref 6.5–8.1)

## 2024-05-24 LAB — CBC WITH DIFFERENTIAL (CANCER CENTER ONLY)
Abs Immature Granulocytes: 0.02 10*3/uL (ref 0.00–0.07)
Basophils Absolute: 0 10*3/uL (ref 0.0–0.1)
Basophils Relative: 0 %
Eosinophils Absolute: 0 10*3/uL (ref 0.0–0.5)
Eosinophils Relative: 1 %
HCT: 35.9 % — ABNORMAL LOW (ref 39.0–52.0)
Hemoglobin: 12.4 g/dL — ABNORMAL LOW (ref 13.0–17.0)
Immature Granulocytes: 0 %
Lymphocytes Relative: 11 %
Lymphs Abs: 0.6 10*3/uL — ABNORMAL LOW (ref 0.7–4.0)
MCH: 32.7 pg (ref 26.0–34.0)
MCHC: 34.5 g/dL (ref 30.0–36.0)
MCV: 94.7 fL (ref 80.0–100.0)
Monocytes Absolute: 0.4 10*3/uL (ref 0.1–1.0)
Monocytes Relative: 7 %
Neutro Abs: 4.4 10*3/uL (ref 1.7–7.7)
Neutrophils Relative %: 81 %
Platelet Count: 178 10*3/uL (ref 150–400)
RBC: 3.79 MIL/uL — ABNORMAL LOW (ref 4.22–5.81)
RDW: 16.1 % — ABNORMAL HIGH (ref 11.5–15.5)
WBC Count: 5.4 10*3/uL (ref 4.0–10.5)
nRBC: 0 % (ref 0.0–0.2)

## 2024-05-24 MED ORDER — DARATUMUMAB-HYALURONIDASE-FIHJ 1800-30000 MG-UT/15ML ~~LOC~~ SOLN
1800.0000 mg | Freq: Once | SUBCUTANEOUS | Status: AC
  Administered 2024-05-24: 1800 mg via SUBCUTANEOUS
  Filled 2024-05-24: qty 15

## 2024-05-24 MED ORDER — DEXAMETHASONE 6 MG PO TABS
20.0000 mg | ORAL_TABLET | Freq: Once | ORAL | Status: AC
  Administered 2024-05-24: 20 mg via ORAL
  Filled 2024-05-24: qty 2

## 2024-05-24 MED ORDER — ACETAMINOPHEN 325 MG PO TABS
650.0000 mg | ORAL_TABLET | Freq: Once | ORAL | Status: AC
  Administered 2024-05-24: 650 mg via ORAL
  Filled 2024-05-24: qty 2

## 2024-05-24 MED ORDER — BORTEZOMIB CHEMO SQ INJECTION 3.5 MG (2.5MG/ML)
1.3000 mg/m2 | Freq: Once | INTRAMUSCULAR | Status: AC
  Administered 2024-05-24: 2.75 mg via SUBCUTANEOUS
  Filled 2024-05-24: qty 1.1

## 2024-05-24 MED ORDER — DIPHENHYDRAMINE HCL 25 MG PO CAPS
25.0000 mg | ORAL_CAPSULE | Freq: Once | ORAL | Status: AC
  Administered 2024-05-24: 25 mg via ORAL
  Filled 2024-05-24: qty 1

## 2024-05-24 MED ORDER — MONTELUKAST SODIUM 10 MG PO TABS
10.0000 mg | ORAL_TABLET | Freq: Once | ORAL | Status: AC
  Administered 2024-05-24: 10 mg via ORAL
  Filled 2024-05-24: qty 1

## 2024-05-24 NOTE — Assessment & Plan Note (Signed)
 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 Pt presented with severe right hip pain and CT and MRI showed a lytic bone lesion in right acetabular.  CT-guided bone biopsy showed plasma neoplasm. -Multiple myeloma panel showed IgG kappa light chain M protein 1.2, CBC and CMP were unremarkable. - He underwent a bone marrow biopsy subsequently, which showed 70% plasma cells, cytogenetics was normal, FISH panel showed 13 q. deletion, gain of 1 q. and 11/11 q, this is a standard risk. - He is a candidate for bone marrow transplant, I recommend first-line chemotherapy daratumumab , Revlimid , Velcade  and dexamethasone . -He underwent open reduction and internal fixation anterior column acetabular fracture at [facility] on 04/18/2024 and received palliative RT after surgery.   -plan to start daratumumab , Velcade  and dexamethasone  on 05/13/2024 and Revlimid  when he receives it later this week.

## 2024-05-24 NOTE — Patient Instructions (Addendum)
 [facility] - A DEPT OF [facility]  Discharge Instructions: Thank you for choosing [facility] to provide your oncology and hematology care.   If you have a lab appointment with the Cancer Center, please go directly to the Cancer Center and check in at the registration area.   Wear comfortable clothing and clothing appropriate for easy access to any Portacath or PICC line.   We strive to give you quality time with your provider. You may need to reschedule your appointment if you arrive late (15 or more minutes).  Arriving late affects you and other patients whose appointments are after yours.  Also, if you miss three or more appointments without notifying the office, you may be dismissed from the clinic at the provider's discretion.      For prescription refill requests, have your pharmacy contact our office and allow 72 hours for refills to be completed.    Today you received the following chemotherapy and/or immunotherapy agents: bortezomib  SQ (VELCADE )  daratumumab -hyaluronidase -fihj (DARZALEX  FASPRO)    To help prevent nausea and vomiting after your treatment, we encourage you to take your nausea medication as directed.  BELOW ARE SYMPTOMS THAT SHOULD BE REPORTED IMMEDIATELY: *FEVER GREATER THAN 100.4 F (38 C) OR HIGHER *CHILLS OR SWEATING *NAUSEA AND VOMITING THAT IS NOT CONTROLLED WITH YOUR NAUSEA MEDICATION *UNUSUAL SHORTNESS OF BREATH *UNUSUAL BRUISING OR BLEEDING *URINARY PROBLEMS (pain or burning when urinating, or frequent urination) *BOWEL PROBLEMS (unusual diarrhea, constipation, pain near the anus) TENDERNESS IN MOUTH AND THROAT WITH OR WITHOUT PRESENCE OF ULCERS (sore throat, sores in mouth, or a toothache) UNUSUAL RASH, SWELLING OR PAIN  UNUSUAL VAGINAL DISCHARGE OR ITCHING   Items with * indicate a potential emergency and should be followed up as soon as possible or go to the Emergency Department if any problems should  occur.  Please show the CHEMOTHERAPY ALERT CARD or IMMUNOTHERAPY ALERT CARD at check-in to the Emergency Department and triage nurse.  Should you have questions after your visit or need to cancel or reschedule your appointment, please contact [facility] - A DEPT OF [facility]  Dept: [phone]  and follow the prompts.  Office hours are 8:00 a.m. to 4:30 p.m. Monday - Friday. Please note that voicemails left after 4:00 p.m. may not be returned until the following business day.  We are closed weekends and major holidays. You have access to a nurse at all times for urgent questions. Please call the main number to the clinic Dept: [phone] and follow the prompts.   For any non-urgent questions, you may also contact your provider using MyChart. We now offer e-Visits for anyone 48 and older to request care online for non-urgent symptoms. For details visit [email or web address].   Also download the MyChart app! Go to the app store, search MyChart, open the app, select [place], and log in with your MyChart username and password.

## 2024-05-24 NOTE — Progress Notes (Signed)
 " [facility]   [phone] [phone]   Clinic Follow up Note   [facility] - General (General Practice)  Date of Service:  05/24/2024  CHIEF COMPLAINT: f/u of multiple myeloma  CURRENT THERAPY:  Weekly daratumumab , Velcade  and dexamethasone , Revlimid  on day 1-14 every 21 days  Oncology History   Multiple myeloma (HCC) RISS stage I, standard risk  -diagnosed in 02/2024. Pt presented with severe right hip pain and CT and MRI showed a lytic bone lesion in right acetabular.  CT-guided bone biopsy showed plasma neoplasm. -Multiple myeloma panel showed IgG kappa light chain M protein 1.2, CBC and CMP were unremarkable. - He underwent a bone marrow biopsy subsequently, which showed 70% plasma cells, cytogenetics was normal, FISH panel showed 13 q. deletion, gain of 1 q. and 11/11 q, this is a standard risk. - He is a candidate for bone marrow transplant, I recommend first-line chemotherapy daratumumab , Revlimid , Velcade  and dexamethasone . -He underwent open reduction and internal fixation anterior column acetabular fracture at [facility] on 04/18/2024 and received palliative RT after surgery.   -plan to start daratumumab , Velcade  and dexamethasone  on 05/13/2024 and Revlimid  when he receives it later this week.   Assessment & Plan Multiple myeloma not in remission Newly diagnosed, standard risk, currently undergoing induction chemotherapy. Disease burden is stable. Mild fatigue and mild peripheral neuropathy, likely multifactorial from bortezomib  and diabetes. Significant immunosuppression and infection risk. Eligible for autologous stem cell transplant; consultation scheduled. No acute cytopenias or organ dysfunction. Prognosis is favorable with expected response to induction and transplant. - Continued induction chemotherapy: daratumumab  (weekly), bortezomib  (weekly from cycle 2), dexamethasone  20 mg weekly, lenalidomide  (2 weeks on,  1 week off per cycle). - Instructed to take lenalidomide  for two weeks followed by a seven-day break; advised to coordinate refills with pharmacy. - Administered premedications prior to infusion: diphenhydramine , acetaminophen , montelukast . - Continued acyclovir  400 mg twice daily for herpes zoster prophylaxis. - Continued aspirin  81 mg daily for thrombosis prophylaxis due to increased risk with lenalidomide . - Monitored for infection, fever, cytopenias, neuropathy, fatigue, thrombosis, and gastrointestinal symptoms. - Maintained up-to-date immunizations (COVID-19, influenza, RSV, herpes zoster). - Monitored blood counts and organ function weekly during induction; plan to decrease frequency if tolerated. - Held treatment for severe cytopenias, infection, or intolerable toxicities. - Scheduled next chemotherapy infusion for February 2. - Adjusted follow-up visits to every other week, with plan to decrease frequency if tolerated. - Scheduled consultation at [facility] for autologous stem cell transplant evaluation.  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 Blood counts stable, without acute symptoms or complications related to anemia. - Monitored blood counts; no intervention required at this time. - Assessed for symptoms of anemia at each visit.  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 Good wound healing, improved pain, ambulates with cane, mild residual limp and weakness. Symptoms consistent with post-surgical and post-radiation recovery. Gradually increasing activity as tolerated. Physical therapy referral pending. Planned zoledronic  acid therapy for bone strengthening, pending dental clearance. - Encouraged gradual increase in activity as tolerated and as permitted by orthopedic surgeon. - Continued use of cane as needed for  ambulation. - Scheduled orthopedic follow-up for February 20. - Discussed potential referral to physical therapy pending orthopedic evaluation. - Obtained dental clearance prior to initiation of zoledronic  acid therapy for bone health.  Plan - He is  tolerating treatment well, lab reviewed, will proceed daratumumab , Velcade  and Dex today and continue weekly - He started Revlimid  yesterday, will continue for 2 weeks on and 1 week off - Follow-up in 2 weeks - Will start Zometa  when we have his dental clearance.   SUMMARY OF ONCOLOGIC HISTORY: Oncology History  Multiple myeloma (HCC)  03/26/2024 Cancer Staging   Staging form: Plasma Cell Myeloma and Plasma Cell Disorders, AJCC 8th Edition - Clinical stage from 03/26/2024: RISS Stage I (Beta-2-microglobulin (mg/L): 2.2, Albumin (g/dL): 3.6, ISS: Stage I, High-risk cytogenetics: Absent, LDH: Normal) - Signed by Lanny Callander, MD on 04/09/2024 Stage prefix: Initial diagnosis Beta 2 microglobulin range (mg/L): Less than 3.5 Albumin range (g/dL): Greater than or equal to 3.5 Cytogenetics: 1q addition, Other mutation   04/09/2024 Initial Diagnosis   Multiple myeloma (HCC)   05/07/2024 -  Chemotherapy   Patient is on Treatment Plan : MYELOMA NEWLY DIAGNOSED TRANSPLANT CANDIDATE DaraVRd (Daratumumab  SQ) with weekly bortezomib  (D1,8,15) q21d x 6 Cycles (Induction/Consolidation)     05/14/2024 - 05/17/2024 Chemotherapy   Patient is on Treatment Plan : MYELOMA NEWLY DIAGNOSED TRANSPLANT CANDIDATE DaraVRd (Daratumumab  SQ) with biweekly bortezomib  (D1,4,8,11) q21d x 6 Cycles (Induction/Consolidation)        Discussed the use of AI scribe software for clinical note transcription with the patient, who gave verbal consent to proceed.  History of Present Illness Aaron Willis is a 64 year old male with newly diagnosed RISS stage I, standard-risk multiple myeloma who presents for follow-up during induction chemotherapy.  He is on induction with  daratumumab , bortezomib , dexamethasone , and recently started lenalidomide  on a two-weeks-on, one-week-off schedule without access issues. One treatment was missed earlier this week due to inclement weather and his regimen has been adjusted to weekly dosing. He takes aspirin  81 mg daily for thrombosis prophylaxis and acyclovir  for herpes zoster prophylaxis as directed. Blood counts have remained stable. He denies bleeding, bruising, or infection.  He has persistent right hip pain and weakness with a limp, worse when rising after prolonged sitting. He sometimes uses a cane at home and is trying to increase activity as tolerated. He has orthopedic follow-up planned in one month and has not started physical therapy.  He notes mild tingling in the hands and feet for about one week that is minimally noticeable.  He has an upcoming consultation at [facility] to discuss autologous stem cell transplant.  May 14, 2024: Follow-up for newly diagnosed multiple myeloma; initiation of induction chemotherapy with daratumumab , bortezomib , and dexamethasone , with lenalidomide  to be started upon receipt. Post-surgical and radiation management of pathological acetabular fracture; pain improved and wound healing satisfactory. Stable mild anemia and diabetes management reviewed; infection risk and immunization updates discussed. Referral for autologous stem cell transplant evaluation ordered; dental clearance for planned zoledronic  acid therapy confirmed.     All other systems were reviewed with the patient and are negative.  MEDICAL HISTORY:  Past Medical History:  Diagnosis Date   Diabetes mellitus without complication (HCC)    Hypertension     SURGICAL HISTORY: Past Surgical History:  Procedure Laterality Date   FEMUR FRACTURE SURGERY Right    IR BONE MARROW BIOPSY & ASPIRATION  03/28/2024   KNEE SURGERY Left     I have reviewed the social history and family history with the patient and they are unchanged  from previous note.  ALLERGIES:  has no active allergies.  MEDICATIONS:  Current Outpatient Medications  Medication Sig Dispense Refill   acetaminophen  (TYLENOL ) 500 MG tablet Take  500 mg by mouth every 6 (six) hours as needed.     acyclovir  (ZOVIRAX ) 400 MG tablet Take 1 tablet (400 mg total) by mouth 2 (two) times daily. 60 tablet 5   aspirin  EC 81 MG tablet Take 81 mg by mouth daily. Swallow whole.     atorvastatin  (LIPITOR) 40 MG tablet Take 40 mg by mouth daily.     Empagliflozin-metFORMIN HCl ER 25-1000 MG TB24 Take 1 tablet by mouth daily.     fluticasone  (FLONASE ) 50 MCG/ACT nasal spray Place 2 sprays into both nostrils daily. (Patient taking differently: Place 2 sprays into both nostrils daily as needed for allergies.) 16 g 6   ibuprofen  (ADVIL ) 600 MG tablet Take 1 tablet (600 mg total) by mouth every 8 (eight) hours as needed.     lenalidomide  (REVLIMID ) 25 MG capsule Take 1 capsule (25 mg total) by mouth daily. Take for 14 days on, 7 days off. Repeat every 21 days. [employer] Auth # [id number] Date Obtained 05/11/24 14 capsule 0   losartan  (COZAAR ) 100 MG tablet Take 50 mg by mouth daily.     pantoprazole  (PROTONIX ) 40 MG tablet Take 1 tablet (40 mg total) by mouth daily. 30 tablet 3   traMADol  (ULTRAM ) 50 MG tablet Take 1 tablet (50 mg total) by mouth every 6 (six) hours as needed. 30 tablet 0   No current facility-administered medications for this visit.    PHYSICAL EXAMINATION: ECOG PERFORMANCE STATUS: 2 - Symptomatic, <50% confined to bed  Vitals:   05/24/24 1200 05/24/24 1248  BP: (!) 158/71 (!) 144/74  Pulse: 84   Resp: 16   Temp: 98.5 F (36.9 C)   SpO2: 99%    Wt Readings from Last 3 Encounters:  05/24/24 214 lb 9.6 oz (97.3 kg)  05/17/24 209 lb (94.8 kg)  05/14/24 209 lb 11.2 oz (95.1 kg)     GENERAL:alert, no distress and comfortable SKIN: skin color, texture, turgor are normal, no rashes or significant lesions EYES: normal, Conjunctiva are pink and  non-injected, sclera clear NECK: supple, thyroid normal size, non-tender, without nodularity LYMPH:  no palpable lymphadenopathy in the cervical, axillary  LUNGS: clear to auscultation and percussion with normal breathing effort HEART: regular rate & rhythm and no murmurs and no lower extremity edema ABDOMEN:abdomen soft, non-tender and normal bowel sounds Musculoskeletal:no cyanosis of digits and no clubbing  NEURO: alert & oriented x 3 with fluent speech, no focal motor/sensory deficits  Physical Exam    LABORATORY DATA:  I have reviewed the data as listed    Latest Ref Rng & Units 05/24/2024   12:10 PM 05/14/2024   10:49 AM 05/01/2024   12:47 PM  CBC  WBC 4.0 - 10.5 K/uL 5.4  5.3  8.5   Hemoglobin 13.0 - 17.0 g/dL 87.5  87.2  87.4   Hematocrit 39.0 - 52.0 % 35.9  37.2  36.3   Platelets 150 - 400 K/uL 178  191  425         Latest Ref Rng & Units 05/24/2024   12:10 PM 05/14/2024   10:49 AM 05/01/2024   12:47 PM  CMP  Glucose 70 - 99 mg/dL 867  890  97   BUN 8 - 23 mg/dL 14  16  12    Creatinine 0.61 - 1.24 mg/dL 9.04  8.97  8.97   Sodium 135 - 145 mmol/L 138  139  137   Potassium 3.5 - 5.1 mmol/L 3.5  4.2  4.1   Chloride  98 - 111 mmol/L 102  102  103   CO2 22 - 32 mmol/L 25  29  28    Calcium  8.9 - 10.3 mg/dL 8.8  9.6  9.5   Total Protein 6.5 - 8.1 g/dL 7.0  7.7  7.8   Total Bilirubin 0.0 - 1.2 mg/dL 0.4  0.4  0.3   Alkaline Phos 38 - 126 U/L 72  70  77   AST 15 - 41 U/L 19  18  24    ALT 0 - 44 U/L 21  20  41       RADIOGRAPHIC STUDIES: I have personally reviewed the radiological images as listed and agreed with the findings in the report. No results found.    No orders of the defined types were placed in this encounter.  All questions were answered. The patient knows to call the clinic with any problems, questions or concerns. No barriers to learning was detected. The total time spent in the appointment was 30 minutes, including review of chart and various tests  results, discussions about plan of care and coordination of care plan     Onita Mattock, MD 05/24/2024     "

## 2024-05-24 NOTE — Progress Notes (Signed)
 Pt observed for 1 hour post Daralex Faspro. Pt tolerated well. VSS at discharge

## 2024-05-25 NOTE — Progress Notes (Signed)
 Per Dr. Lanny His second dose Dara was postponed from 1/26 to1/29, so I am ok to let him get again next Monday 2/2 and will do weekly after that. Pt to being weekly dara and bortezomib  on 2/2.  Wasyl Dornfeld, PharmD, MBA

## 2024-05-27 ENCOUNTER — Telehealth: Payer: Self-pay | Admitting: Hematology

## 2024-05-27 NOTE — Telephone Encounter (Signed)
 Rescheduled appointments due to the [facility] being closed on Monday 2/2. Called and left a VM with the changes made to the patients upcoming appointments.

## 2024-05-28 ENCOUNTER — Inpatient Hospital Stay: Admitting: Hematology

## 2024-05-28 ENCOUNTER — Inpatient Hospital Stay

## 2024-05-28 ENCOUNTER — Inpatient Hospital Stay: Admitting: Nurse Practitioner

## 2024-05-29 ENCOUNTER — Inpatient Hospital Stay

## 2024-05-29 VITALS — BP 95/58 | HR 78 | Temp 98.4°F | Resp 21 | Wt 209.8 lb

## 2024-05-29 DIAGNOSIS — C9000 Multiple myeloma not having achieved remission: Secondary | ICD-10-CM

## 2024-05-29 LAB — CBC WITH DIFFERENTIAL (CANCER CENTER ONLY)
Abs Immature Granulocytes: 0.02 10*3/uL (ref 0.00–0.07)
Basophils Absolute: 0 10*3/uL (ref 0.0–0.1)
Basophils Relative: 0 %
Eosinophils Absolute: 0.1 10*3/uL (ref 0.0–0.5)
Eosinophils Relative: 2 %
HCT: 36.9 % — ABNORMAL LOW (ref 39.0–52.0)
Hemoglobin: 12.9 g/dL — ABNORMAL LOW (ref 13.0–17.0)
Immature Granulocytes: 0 %
Lymphocytes Relative: 19 %
Lymphs Abs: 1 10*3/uL (ref 0.7–4.0)
MCH: 33.2 pg (ref 26.0–34.0)
MCHC: 35 g/dL (ref 30.0–36.0)
MCV: 94.9 fL (ref 80.0–100.0)
Monocytes Absolute: 0.3 10*3/uL (ref 0.1–1.0)
Monocytes Relative: 6 %
Neutro Abs: 3.7 10*3/uL (ref 1.7–7.7)
Neutrophils Relative %: 73 %
Platelet Count: 202 10*3/uL (ref 150–400)
RBC: 3.89 MIL/uL — ABNORMAL LOW (ref 4.22–5.81)
RDW: 16 % — ABNORMAL HIGH (ref 11.5–15.5)
WBC Count: 5.2 10*3/uL (ref 4.0–10.5)
nRBC: 0 % (ref 0.0–0.2)

## 2024-05-29 LAB — CMP (CANCER CENTER ONLY)
ALT: 21 U/L (ref 0–44)
AST: 17 U/L (ref 15–41)
Albumin: 4 g/dL (ref 3.5–5.0)
Alkaline Phosphatase: 69 U/L (ref 38–126)
Anion gap: 10 (ref 5–15)
BUN: 12 mg/dL (ref 8–23)
CO2: 28 mmol/L (ref 22–32)
Calcium: 9 mg/dL (ref 8.9–10.3)
Chloride: 99 mmol/L (ref 98–111)
Creatinine: 1.01 mg/dL (ref 0.61–1.24)
GFR, Estimated: >60 mL/min
Glucose, Bld: 205 mg/dL — ABNORMAL HIGH (ref 70–99)
Potassium: 3.7 mmol/L (ref 3.5–5.1)
Sodium: 136 mmol/L (ref 135–145)
Total Bilirubin: 0.6 mg/dL (ref 0.0–1.2)
Total Protein: 6.9 g/dL (ref 6.5–8.1)

## 2024-05-29 MED ORDER — DARATUMUMAB-HYALURONIDASE-FIHJ 1800-30000 MG-UT/15ML ~~LOC~~ SOLN
1800.0000 mg | Freq: Once | SUBCUTANEOUS | Status: AC
  Administered 2024-05-29: 1800 mg via SUBCUTANEOUS
  Filled 2024-05-29: qty 15

## 2024-05-29 MED ORDER — MONTELUKAST SODIUM 10 MG PO TABS
10.0000 mg | ORAL_TABLET | Freq: Once | ORAL | Status: AC
  Administered 2024-05-29: 10 mg via ORAL
  Filled 2024-05-29: qty 1

## 2024-05-29 MED ORDER — BORTEZOMIB CHEMO SQ INJECTION 3.5 MG (2.5MG/ML)
1.3000 mg/m2 | Freq: Once | INTRAMUSCULAR | Status: AC
  Administered 2024-05-29: 2.75 mg via SUBCUTANEOUS
  Filled 2024-05-29: qty 1.1

## 2024-05-29 MED ORDER — ACETAMINOPHEN 325 MG PO TABS
650.0000 mg | ORAL_TABLET | Freq: Once | ORAL | Status: AC
  Administered 2024-05-29: 650 mg via ORAL
  Filled 2024-05-29: qty 2

## 2024-05-29 MED ORDER — DIPHENHYDRAMINE HCL 25 MG PO CAPS
25.0000 mg | ORAL_CAPSULE | Freq: Once | ORAL | Status: AC
  Administered 2024-05-29: 25 mg via ORAL
  Filled 2024-05-29: qty 1

## 2024-05-29 MED ORDER — DEXAMETHASONE 6 MG PO TABS
20.0000 mg | ORAL_TABLET | Freq: Once | ORAL | Status: AC
  Administered 2024-05-29: 20 mg via ORAL
  Filled 2024-05-29: qty 2

## 2024-05-30 LAB — KAPPA/LAMBDA LIGHT CHAINS
Kappa free light chain: 16 mg/L (ref 3.3–19.4)
Kappa, lambda light chain ratio: 2.03 — ABNORMAL HIGH (ref 0.26–1.65)
Lambda free light chains: 7.9 mg/L (ref 5.7–26.3)

## 2024-05-31 LAB — MULTIPLE MYELOMA PANEL, SERUM
Albumin SerPl Elph-Mcnc: 3.3 g/dL (ref 2.9–4.4)
Albumin/Glob SerPl: 1.2 (ref 0.7–1.7)
Alpha 1: 0.2 g/dL (ref 0.0–0.4)
Alpha2 Glob SerPl Elph-Mcnc: 0.9 g/dL (ref 0.4–1.0)
B-Globulin SerPl Elph-Mcnc: 0.9 g/dL (ref 0.7–1.3)
Gamma Glob SerPl Elph-Mcnc: 0.9 g/dL (ref 0.4–1.8)
Globulin, Total: 2.9 g/dL (ref 2.2–3.9)
IgA: 25 mg/dL — ABNORMAL LOW (ref 61–437)
IgG (Immunoglobin G), Serum: 1077 mg/dL (ref 603–1613)
IgM (Immunoglobulin M), Srm: 42 mg/dL (ref 20–172)
M Protein SerPl Elph-Mcnc: 0.7 g/dL — ABNORMAL HIGH
Total Protein ELP: 6.2 g/dL (ref 6.0–8.5)

## 2024-06-04 ENCOUNTER — Inpatient Hospital Stay

## 2024-06-04 ENCOUNTER — Inpatient Hospital Stay: Admitting: Hematology

## 2024-06-07 ENCOUNTER — Inpatient Hospital Stay

## 2024-06-11 ENCOUNTER — Inpatient Hospital Stay: Admitting: Hematology

## 2024-06-11 ENCOUNTER — Inpatient Hospital Stay

## 2024-06-14 ENCOUNTER — Ambulatory Visit: Admitting: Radiation Oncology

## 2024-06-14 ENCOUNTER — Inpatient Hospital Stay

## 2024-06-18 ENCOUNTER — Inpatient Hospital Stay

## 2024-06-18 ENCOUNTER — Inpatient Hospital Stay: Admitting: Hematology

## 2024-06-25 ENCOUNTER — Inpatient Hospital Stay

## 2024-06-25 ENCOUNTER — Inpatient Hospital Stay: Admitting: Hematology

## 2024-06-28 ENCOUNTER — Inpatient Hospital Stay

## 2024-07-02 ENCOUNTER — Inpatient Hospital Stay

## 2024-07-02 ENCOUNTER — Inpatient Hospital Stay: Admitting: Hematology

## 2024-07-05 ENCOUNTER — Inpatient Hospital Stay

## 2024-07-09 ENCOUNTER — Inpatient Hospital Stay: Admitting: Hematology

## 2024-07-09 ENCOUNTER — Inpatient Hospital Stay
